# Patient Record
Sex: Male | Born: 1940 | Race: Black or African American | Hispanic: No
Health system: Southern US, Community
[De-identification: ages and names within clinical notes are randomized; demographics above are authoritative.]

## PROBLEM LIST (undated history)

## (undated) DIAGNOSIS — R569 Unspecified convulsions: Secondary | ICD-10-CM

## (undated) DIAGNOSIS — F0390 Unspecified dementia without behavioral disturbance: Secondary | ICD-10-CM

## (undated) HISTORY — DX: Unspecified convulsions: R56.9

---

## 2017-08-16 ENCOUNTER — Emergency Department (HOSPITAL_COMMUNITY): Payer: Medicare Other

## 2017-08-16 ENCOUNTER — Encounter (HOSPITAL_COMMUNITY): Payer: Self-pay | Admitting: Emergency Medicine

## 2017-08-16 ENCOUNTER — Emergency Department (HOSPITAL_COMMUNITY)
Admission: EM | Admit: 2017-08-16 | Discharge: 2017-08-17 | Disposition: A | Discharge status: Home / Self Care | Payer: Medicare Other | Attending: Emergency Medicine | Admitting: Emergency Medicine

## 2017-08-16 DIAGNOSIS — R600 Localized edema: Secondary | ICD-10-CM | POA: Insufficient documentation

## 2017-08-16 DIAGNOSIS — R748 Abnormal levels of other serum enzymes: Secondary | ICD-10-CM | POA: Diagnosis not present

## 2017-08-16 DIAGNOSIS — R404 Transient alteration of awareness: Secondary | ICD-10-CM | POA: Insufficient documentation

## 2017-08-16 DIAGNOSIS — Z79899 Other long term (current) drug therapy: Secondary | ICD-10-CM | POA: Diagnosis not present

## 2017-08-16 DIAGNOSIS — F0390 Unspecified dementia without behavioral disturbance: Secondary | ICD-10-CM | POA: Diagnosis not present

## 2017-08-16 HISTORY — DX: Unspecified dementia, unspecified severity, without behavioral disturbance, psychotic disturbance, mood disturbance, and anxiety: F03.90

## 2017-08-16 LAB — CBC WITH DIFFERENTIAL/PLATELET
Basophils Absolute: 0 10*3/uL (ref 0.0–0.1)
Basophils Relative: 0 %
EOS PCT: 0 %
Eosinophils Absolute: 0 10*3/uL (ref 0.0–0.7)
HCT: 38.2 % — ABNORMAL LOW (ref 39.0–52.0)
Hemoglobin: 12.2 g/dL — ABNORMAL LOW (ref 13.0–17.0)
LYMPHS ABS: 2.4 10*3/uL (ref 0.7–4.0)
LYMPHS PCT: 26 %
MCH: 27.1 pg (ref 26.0–34.0)
MCHC: 31.9 g/dL (ref 30.0–36.0)
MCV: 84.7 fL (ref 78.0–100.0)
MONO ABS: 0.7 10*3/uL (ref 0.1–1.0)
Monocytes Relative: 7 %
Neutro Abs: 6 10*3/uL (ref 1.7–7.7)
Neutrophils Relative %: 67 %
PLATELETS: 180 10*3/uL (ref 150–400)
RBC: 4.51 MIL/uL (ref 4.22–5.81)
RDW: 14.9 % (ref 11.5–15.5)
WBC: 9.1 10*3/uL (ref 4.0–10.5)

## 2017-08-16 LAB — COMPREHENSIVE METABOLIC PANEL
ALT: 13 U/L — ABNORMAL LOW (ref 17–63)
ANION GAP: 7 (ref 5–15)
AST: 22 U/L (ref 15–41)
Albumin: 3.5 g/dL (ref 3.5–5.0)
Alkaline Phosphatase: 658 U/L — ABNORMAL HIGH (ref 38–126)
BUN: 21 mg/dL — ABNORMAL HIGH (ref 6–20)
CHLORIDE: 104 mmol/L (ref 101–111)
CO2: 27 mmol/L (ref 22–32)
Calcium: 9.6 mg/dL (ref 8.9–10.3)
Creatinine, Ser: 1.18 mg/dL (ref 0.61–1.24)
GFR, EST NON AFRICAN AMERICAN: 58 mL/min — AB (ref >60)
Glucose, Bld: 106 mg/dL — ABNORMAL HIGH (ref 65–99)
POTASSIUM: 3.4 mmol/L — AB (ref 3.5–5.1)
Sodium: 138 mmol/L (ref 135–145)
Total Bilirubin: 0.7 mg/dL (ref 0.3–1.2)
Total Protein: 7 g/dL (ref 6.5–8.1)

## 2017-08-16 LAB — I-STAT TROPONIN, ED: Troponin i, poc: 0.02 ng/mL (ref 0.00–0.08)

## 2017-08-16 LAB — ACETAMINOPHEN LEVEL

## 2017-08-16 LAB — SALICYLATE LEVEL

## 2017-08-16 LAB — CBG MONITORING, ED: GLUCOSE-CAPILLARY: 92 mg/dL (ref 65–99)

## 2017-08-16 MED ORDER — LORAZEPAM 2 MG/ML IJ SOLN
0.5000 mg | Freq: Once | INTRAMUSCULAR | Status: AC | PRN
Start: 2017-08-16 — End: 2017-08-16
  Administered 2017-08-16: 0.5 mg via INTRAVENOUS
  Filled 2017-08-16: qty 1

## 2017-08-16 MED ORDER — SODIUM CHLORIDE 0.9 % IV BOLUS
500.0000 mL | Freq: Once | INTRAVENOUS | Status: AC
  Administered 2017-08-16: 500 mL via INTRAVENOUS

## 2017-08-16 MED ORDER — LEVETIRACETAM 500 MG PO TABS: 500.0000 mg | ORAL_TABLET | Freq: Two times a day (BID) | ORAL | 0 refills | Status: AC

## 2017-08-16 MED ORDER — LEVETIRACETAM 500 MG PO TABS
500.0000 mg | ORAL_TABLET | Freq: Once | ORAL | Status: AC
  Administered 2017-08-17: 500 mg via ORAL
  Filled 2017-08-16: qty 1

## 2017-08-16 NOTE — ED Provider Notes (Addendum)
[facility] EMERGENCY DEPARTMENT Provider Note   CSN: [id number] Arrival date & time: 08/16/17  1400     History   Chief Complaint Chief Complaint  Patient presents with  . Seizures    HPI Christopher Hobbs is a 77 y.o. male with a history of dementia BIB EMS from [facility] for a chief complaint of unresponsiveness.  The patient's niece reports that she received a call from staff stating that the patient was not responsive, was barely breathing, and that they were going to call EMS.  The patient reports that she arrived to the facility within minutes and found the patient sitting in a chair in the dining room slumped over with drool down his shirt. His clothes were soaked with urine and stool. His eyes were closed when she arrived, but he held and squeeze her hand, and would answer yes and no to questions.  She denies any unresponsiveness, shaking, jerking since her arrival.  No staff was with the patient upon her arrival.  Past medical history includes dementia.  The patient is oriented to self  at baseline and intermittently to place.  He has a history of chronic bilateral lower extremity edema and takes 40 mg of Lasix daily.  Family reports he has been started on several mood stabilizers including oxcarbazepine, trazodone, citalopram, and Zoloft since he has been at [facility].  [place] 5x since patient's arrival in the ED, but have not been able to make contact with staff.   Level 5 caveat secondary to dementia.   The history is provided by the patient. No language interpreter was used.  Seizures   Pertinent negatives include no vomiting.    Past Medical History:  Diagnosis Date  . Dementia     There are no active problems to display for this patient.   History reviewed. No pertinent surgical history.      Home Medications    Prior to Admission medications   Medication Sig Start Date End Date Taking? Authorizing Provider  furosemide  (LASIX) 40 MG tablet Take 40 mg by mouth.   Yes [provider]  ipratropium-albuterol (DUONEB) 0.5-2.5 (3) MG/3ML SOLN Take 3 mLs by nebulization as needed.   Yes [provider]  Melatonin 3 MG TABS Take 6 mg by mouth at bedtime.   Yes [provider]  nystatin (NYSTATIN) powder Apply 1 Bottle topically 2 (two) times daily.   Yes [provider]  Oxcarbazepine (TRILEPTAL) 300 MG tablet Take 300 mg by mouth 2 (two) times daily. Stable mood   Yes [provider]  sertraline (ZOLOFT) 50 MG tablet Take 50 mg by mouth daily.   Yes [provider]  Skin Protectants, Misc. (EUCERIN) cream Apply 1 application topically 2 (two) times daily.   Yes [provider]  traZODone (DESYREL) 50 MG tablet Take 25 mg by mouth 3 (three) times daily.   Yes [provider]  vitamin B-12 (CYANOCOBALAMIN) 1000 MCG tablet Take 1,000 mcg by mouth daily.   Yes [provider]  levETIRAcetam (KEPPRA) 500 MG tablet Take 1 tablet (500 mg total) by mouth 2 (two) times daily. 08/16/17 09/15/17  Lorayne Bender, PA-C    Family History No family history on file.  Social History Social History   Tobacco Use  . Smoking status: Not on file  Substance Use Topics  . Alcohol use: Not on file  . Drug use: Not on file     Allergies   Patient  has no known allergies.   Review of Systems Review of Systems  Unable to perform ROS: Dementia  Respiratory: Negative for shortness of breath.   Cardiovascular: Positive for leg swelling (chronic).  Gastrointestinal: Negative for vomiting.     Physical Exam Updated Vital Signs BP (!) 154/93 (BP Location: Right Arm)   Pulse 79   Temp 98.2 F (36.8 C)   Resp 17   SpO2 95%   Physical Exam  Constitutional: He is oriented to person, place, and time. He appears well-developed and well-nourished.  HENT:  Head: Normocephalic and atraumatic.  Eyes: Pupils are equal, round, and reactive to light. EOM  are normal. No scleral icterus.  Neck: Normal range of motion. Neck supple.  Cardiovascular: Normal rate, regular rhythm, normal heart sounds and intact distal pulses. Exam reveals no gallop and no friction rub.  No murmur heard. Pulmonary/Chest: Effort normal and breath sounds normal. No stridor. No respiratory distress. He has no wheezes. He has no rales. He exhibits no tenderness.  Abdominal: Soft. Bowel sounds are normal. He exhibits no distension and no mass. There is no tenderness. There is no rebound and no guarding. No hernia.  Musculoskeletal:  Significant edema to the bilateral lower extremities, which appears chronic.  Neurological: He is alert and oriented to person, place, and time. No cranial nerve deficit.  Oriented to self.  Patient intermittently follows commands.   Skin: Skin is warm and dry. Capillary refill takes less than 2 seconds. No rash noted. No erythema. No pallor.  Psychiatric: He has a normal mood and affect. His behavior is normal.   ED Treatments / Results  Labs (all labs ordered are listed, but only abnormal results are displayed) Labs Reviewed  CBC WITH DIFFERENTIAL/PLATELET - Abnormal; Notable for the following components:      Result Value   Hemoglobin 12.2 (*)    HCT 38.2 (*)    All other components within normal limits  COMPREHENSIVE METABOLIC PANEL - Abnormal; Notable for the following components:   Potassium 3.4 (*)    Glucose, Bld 106 (*)    BUN 21 (*)    ALT 13 (*)    Alkaline Phosphatase 658 (*)    GFR calc non Af Amer 58 (*)    All other components within normal limits  ACETAMINOPHEN LEVEL - Abnormal; Notable for the following components:   Acetaminophen (Tylenol), Serum <10 (*)    All other components within normal limits  SALICYLATE LEVEL  AMMONIA  CBG MONITORING, ED  I-STAT TROPONIN, ED    EKG EKG Interpretation  Date/Time:  Tuesday August 16 2017 16:49:04 EDT Ventricular Rate:  68 PR Interval:  220 QRS Duration: 110 QT  Interval:  424 QTC Calculation: 450 R Axis:   48 Text Interpretation:  Sinus rhythm with 1st degree A-V block Otherwise normal ECG No old tracing to compare Confirmed by Merrily Pew [phone]) on 08/16/2017 8:16:00 PM   Radiology Dg Chest 2 View  Result Date: 08/16/2017 CLINICAL DATA:  Unresponsive episode. EXAM: CHEST - 2 VIEW COMPARISON:  None. FINDINGS: Mild cardiomegaly is noted. No pneumothorax or pleural effusion is noted. No acute pulmonary disease is noted. Bony thorax is unremarkable. IMPRESSION: No active cardiopulmonary disease. Electronically Signed   By: Marijo Conception, M.D.   On: 08/16/2017 15:42   Ct Head Wo Contrast  Result Date: 08/16/2017 CLINICAL DATA:  Altered mental status, seizure like activity. EXAM: CT HEAD WITHOUT CONTRAST TECHNIQUE: Contiguous axial images were obtained from the base of the skull  through the vertex without intravenous contrast. COMPARISON:  None. FINDINGS: BRAIN: No intraparenchymal hemorrhage, mass effect nor midline shift. Moderate to severe ventriculomegaly with disproportion mesial temporal lobe volume loss. Patchy supratentorial white matter hypodensities. No acute large vascular territory infarcts. No abnormal extra-axial fluid collections. Basal cisterns are patent. VASCULAR: Mild calcific atherosclerosis of the carotid siphons. SKULL: No skull fracture. No significant scalp soft tissue swelling. SINUSES/ORBITS: Severe chronic RIGHT maxillary sinusitis. Mastoid air cells are well aerated. Soft tissue within the external auditory canals compatible with cerumen.The included ocular globes and orbital contents are non-suspicious. OTHER: None. IMPRESSION: 1. No acute intracranial process. 2. Moderate to severe atrophy with disproportionate mesial temporal lobe volume loss associated with neurodegenerative syndromes. 3. Mild to moderate chronic small vessel ischemic disease. Electronically Signed   By: Elon Alas M.D.   On: 08/16/2017 16:04     Procedures Procedures (including critical care time)  Medications Ordered in ED Medications  sodium chloride 0.9 % bolus 500 mL (0 mLs Intravenous Stopped 08/17/17 0032)  LORazepam (ATIVAN) injection 0.5 mg (0.5 mg Intravenous Given 08/16/17 2307)  levETIRAcetam (KEPPRA) tablet 500 mg (500 mg Oral Given 08/17/17 0011)     Initial Impression / Assessment and Plan / ED Course  I have reviewed the triage vital signs and the nursing notes.  Pertinent labs & imaging results that were available during my care of the patient were reviewed by me and considered in my medical decision making (see chart for details).  Clinical Course as of Aug 18 603  Tue Aug 16, 2017  1800 Attempted to look for adequate venous access for the purpose of blood draw, however, patient would not cooperate and would not allow me to even hold his arm or assess with the ultrasound, despite encouragement by his niece and great niece. Patient acknowledges the need for blood work, but states, "I hear what you are saying, but they already tried multiple times and I do not want to do it again."  Patient's niece and POA is at the bedside and agrees with waiting.   [SJ]  2322 Spoke with Dr. Ardis Hughs, [facility]. Typically not a worry in isolation. Not an emergent finding. States this can be worked up on an outpatient basis by patient's PCP or with GI.   Alkaline Phosphatase(!): 658 [SJ]  2335 Spoke with Dr. Rory Percy, neurologist.  Presentation sounds more consistent with seizure rather than TIA.  Requests ammonia level due to elevated alk phos. Start patient on Keppra 500 mg twice daily.  No loading dose necessary.   [SJ]  Wed Aug 17, 2017  0034 Suspected to be due to minor dehydration.  BUN(!): 21 [SJ]  0035 Patient would not lay still for the MRI, despite administration of Ativan for anxiety.  MR Brain Wo Contrast [SJ]    Clinical Course User Index [SJ] Joy, Shawn C, PA-C    77 year old male with a history of dementia  and chronic lower extremity edema presents from [place] facility for questionable episode of unresponsiveness versus seizure-like activity.  Patient is oriented only to self.  History provided by family as staff from [facility] has not been able to be reached.  On exam, the patient has bilateral lower extremity edema, which appears to be chronic.  He is oriented only to self.  No other focal findings at this time.  No seizure-like activity or syncope observed since arrival in the ED. NAD and patient is hemodynamically stable.  Patient was discussed with Dr. Dayna Barker, attending  physician.Patient care transferred to [facility] at the end of my shift pending labs, imaging, and EKG. Patient presentation, ED course, and plan of care discussed with review of all pertinent labs and imaging.  Please see his/her note for further details regarding further ED course and disposition.   Final Clinical Impressions(s) / ED Diagnoses   Final diagnoses:  Elevated alkaline phosphatase level  Transient alteration of awareness    ED Discharge Orders        Ordered    levETIRAcetam (KEPPRA) 500 MG tablet  2 times daily     08/16/17 2354       Jhamal Plucinski A, PA-C 08/16/17 1638    Zynia Wojtowicz A, PA-C 08/17/17 0919    Merrily Pew, MD 08/18/17 0045

## 2017-08-16 NOTE — ED Notes (Signed)
Pt arrived from Nursing home covered in stool wearing 3 briefs , pt pulled to rest room and cleaned up

## 2017-08-16 NOTE — ED Notes (Signed)
Clean patient up with the help of emt and charge nurse patient was covered up to his back with feces patient is now clean resting with family at bedside

## 2017-08-16 NOTE — ED Notes (Signed)
Writer attempted 3X for blood work, unsuccessful.

## 2017-08-16 NOTE — ED Notes (Signed)
Pt pulled out IV, PA unable to get ultrasound IV, IV team order placed

## 2017-08-16 NOTE — ED Notes (Signed)
Patient transported to MRI 

## 2017-08-16 NOTE — ED Notes (Signed)
Patient transported to X-ray 

## 2017-08-16 NOTE — ED Provider Notes (Signed)
Christopher Hobbs is a 77 y.o. male, with a history of dementia, presenting to the ED with episode of abnormal behavior.  Patient's niece at the bedside states she received a phone call at around 1 PM today from patient's facility, [facility], telling her that the patient was less responsive than normal.  When she arrived at the facility approximately 10 minutes later patient was seated at the table in the dining room, but leaning forward against the table with some noted drooling.  When she took his hand, he held her hand and verbally responded to her, but seems more confused.  Patient then suddenly became alert and oriented to self, as per his baseline.  Entire episode lasted approximately 15 minutes.  Facility was concerned for possible seizure, though there is no seizure activity noted. Patient denies any pain or complaints.  Niece denies shaking or seizures, known recent fever, illness, trauma, vomiting, diarrhea, or any other abnormalities.  Level 5 caveat due to dementia.  Patient's niece and POA, Marliss Coots, at the bedside providing much of the patient's history.   HPI from Joline Maxcy, PA-C: "Jonathon Tan is a 77 y.o. male with a history of dementia BIB EMS from [facility] for a chief complaint of unresponsiveness.  The patient's niece reports that she received a call from staff stating that the patient was not responsive, was barely breathing, and that they were going to call EMS.  The patient reports that she arrived to the facility within minutes and found the patient sitting in a chair in the dining room slumped over with drool down his shirt. His clothes were soaked with urine and stool. His eyes were closed when she arrived, but he held and squeeze her hand, and would answer yes and no to questions.  She denies any unresponsiveness, shaking, jerking since her arrival.  No staff was with the patient upon her arrival.  Past medical history includes dementia.  The patient is oriented to self  at  baseline and intermittently to place.  He has a history of chronic bilateral lower extremity edema and takes 40 mg of Lasix daily.  Family reports he has been started on several mood stabilizers including oxcarbazepine, trazodone, citalopram, and Zoloft since he has been at [facility].  [place] 5x since patient's arrival in the ED, but have not been able to make contact with staff."  Past Medical History:  Diagnosis Date  . Dementia      Physical Exam  BP 121/76 (BP Location: Right Arm)   Pulse 62   Temp 98.5 F (36.9 C) (Oral)   Resp 17   SpO2 100%   Physical Exam  Constitutional: He appears well-developed and well-nourished. No distress.  HENT:  Head: Normocephalic and atraumatic.  Eyes: Conjunctivae are normal.  Neck: Neck supple.  Cardiovascular: Normal rate, regular rhythm, normal heart sounds and intact distal pulses.  Pulmonary/Chest: Effort normal and breath sounds normal. No respiratory distress.  Abdominal: Soft. There is no tenderness. There is no guarding.  Musculoskeletal: He exhibits no edema.  Lymphadenopathy:    He has no cervical adenopathy.  Neurological: He is alert.  Patient is oriented to self, which is his reported baseline.  He is interactive and will hold some back-and-forth conversation.  He seems to understand that he is in a hospital and voices understanding with explanations of the proposed interventions. No sensory deficits.  No noted speech deficits. No aphasia. Patient handles oral secretions without difficulty. No noted swallowing defects.  Equal  grip strength bilaterally. Strength 5/5 in the upper extremities. Strength 5/5 with flexion and extension at the bilateral ankles. Cranial nerves III-XII grossly intact.  No facial droop.   Skin: Skin is warm and dry. He is not diaphoretic.  Psychiatric: He has a normal mood and affect. His behavior is normal.  Nursing note and vitals reviewed.   ED Course/Procedures   Clinical  Course as of Aug 18 34  Tue Aug 16, 2017  1800 Attempted to look for adequate venous access for the purpose of blood draw, however, patient would not cooperate and would not allow me to even hold his arm or assess with the ultrasound, despite encouragement by his niece and great niece. Patient acknowledges the need for blood work, but states, "I hear what you are saying, but they already tried multiple times and I do not want to do it again."  Patient's niece and POA is at the bedside and agrees with waiting.   [SJ]  2322 Spoke with Dr. Ardis Hughs, [facility]. Typically not a worry in isolation. Not an emergent finding. States this can be worked up on an outpatient basis by patient's PCP or with GI.   Alkaline Phosphatase(!): 658 [SJ]  2335 Spoke with Dr. Rory Percy, neurologist.  Presentation sounds more consistent with seizure rather than TIA.  Requests ammonia level due to elevated alk phos. Start patient on Keppra 500 mg twice daily.  No loading dose necessary.   [SJ]  Wed Aug 17, 2017  0034 Suspected to be due to minor dehydration.  BUN(!): 21 [SJ]  0035 Patient would not lay still for the MRI, despite administration of Ativan for anxiety.  MR Brain Wo Contrast [SJ]    Clinical Course User Index [SJ] Joy, Helane Gunther, PA-C    Procedures  MDM    Took patient care handoff report from [employer], PA-C.   Patient presents following episode of decreased responsiveness.  Patient responding to his baseline upon arrival in the ED and had no further episodes throughout ED course.   Patient is nontoxic appearing, afebrile, not tachycardic, not tachypneic, not hypotensive, excellent SPO2 on room air, and is in no apparent distress.  Seizure is high on differential.  Neurology follow-up.   Difficulty obtaining blood work during the ED course.  Patient oriented enough to refuse subsequent needle sticks after first attempts.  Similarly, patient would not provide urine and refused cath.  He did  eventually allow another attempt at blood draw, which was successful.  Since patient was oriented enough to acknowledge the proposed procedure and he is stable here in the ED, I do not think forcing the patient or sedating him is warranted.  Findings and plan of care discussed with Merrily Pew, MD.   Vitals:   08/16/17 1408 08/16/17 2341 08/17/17 0010  BP: 121/76 (!) 154/93   Pulse: 62 79   Resp: 17 17   Temp: 98.5 F (36.9 C) 98.2 F (36.8 C) 98.2 F (36.8 C)  TempSrc: Oral Oral   SpO2: 100% 95%       Layla Maw 08/17/17 0036    Mesner, Corene Cornea, MD 08/18/17 0045

## 2017-08-16 NOTE — ED Triage Notes (Signed)
Pt here from [facility] home with c/o seizure like activity / family stated that pt was abnormal for a few mins and then was fine , no one witnessed any "shaking or seizure like activity

## 2017-08-16 NOTE — Discharge Instructions (Addendum)
There were no acute abnormalities on the CT scan.  There was some evidence of dehydration. Please encourage patient to drink more water. One of the patient's lab results, called alkaline phosphatase, was noted to be quite elevated.  This will need to be assessed by the patient's primary care provider or with the gastroenterologist..  It is possible that the patient had a seizure.  Please begin administering the Keppra 500 mg twice daily.  Follow-up with a neurologist.

## 2017-08-16 NOTE — ED Notes (Signed)
Informed Ernie Avena PA this RN, 2 phlebotomist unable to draw labs

## 2017-08-16 NOTE — ED Notes (Signed)
Pt unable to tolerate mri

## 2017-08-17 DIAGNOSIS — R404 Transient alteration of awareness: Secondary | ICD-10-CM | POA: Diagnosis not present

## 2017-08-17 LAB — AMMONIA: AMMONIA: 29 umol/L (ref 9–35)

## 2017-08-17 NOTE — ED Notes (Signed)
Pt family members d/c instructions gone over and with facility

## 2017-08-29 ENCOUNTER — Ambulatory Visit: Payer: Medicare Other | Admitting: Neurology

## 2017-08-29 ENCOUNTER — Encounter: Payer: Self-pay | Admitting: Neurology

## 2020-04-21 IMAGING — CT CT HEAD W/O CM
4 series · 16 of 47 positions shown, 18 images · non-contrast
Comparison: None.

CLINICAL DATA: Altered mental status, seizure like activity.

EXAM:
CT HEAD WITHOUT CONTRAST
TECHNIQUE: Contiguous axial images were obtained from the base of the skull
through the vertex without intravenous contrast.

[Series 3: head wo · axial · 0.48mm/px · z∈[-104,+16]mm · 7 of 33 slices shown, 9 images]
[im 5/33  brain]
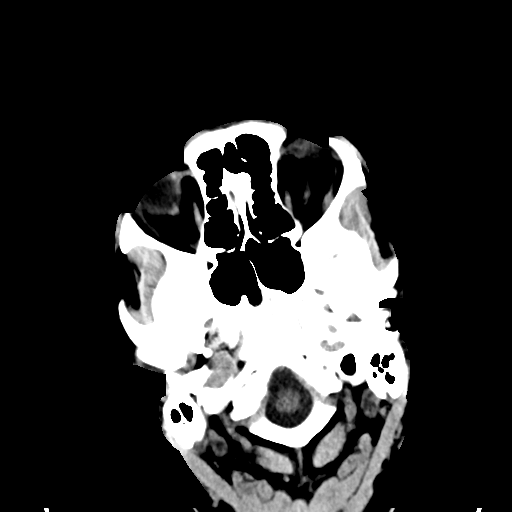
[im 5/33  bone]
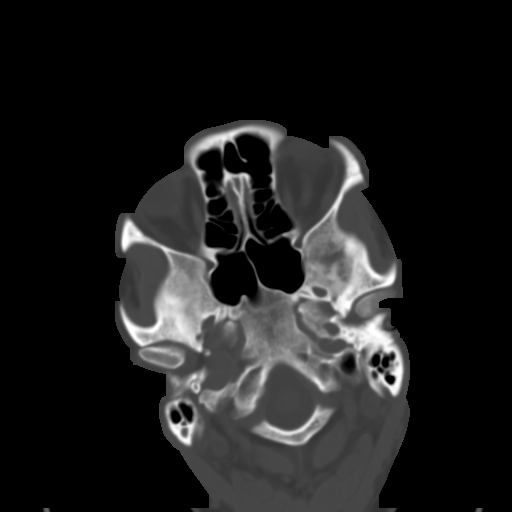
[im 9/33  brain]
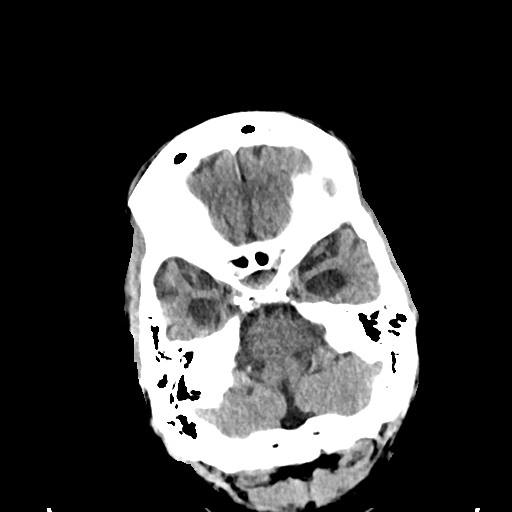
[im 13/33  brain]
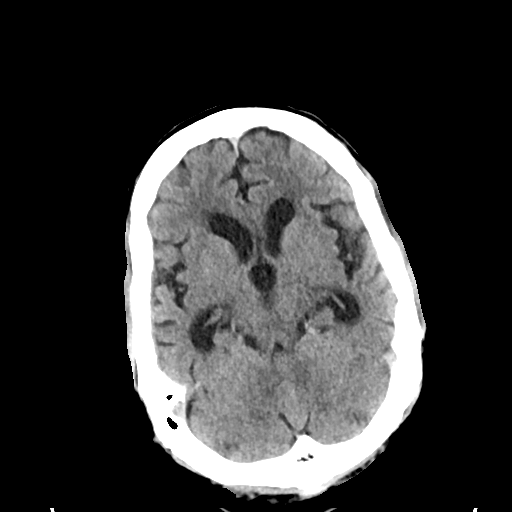
[im 17/33  brain]
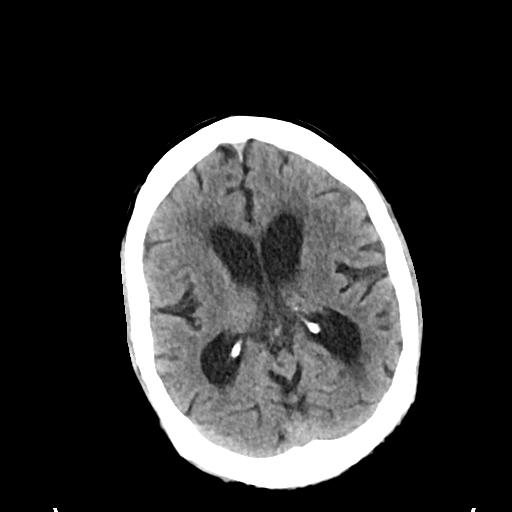
[im 21/33  brain]
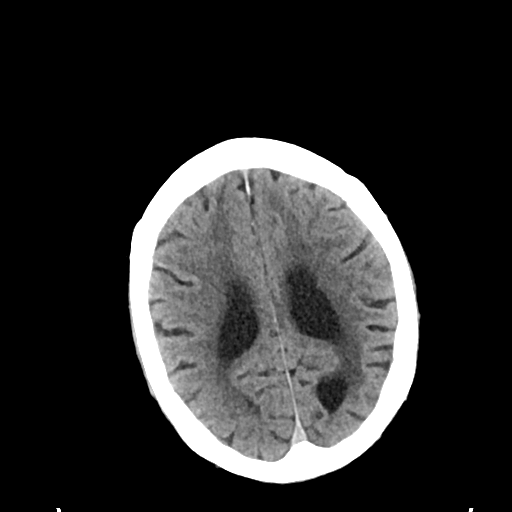
[im 21/33  bone]
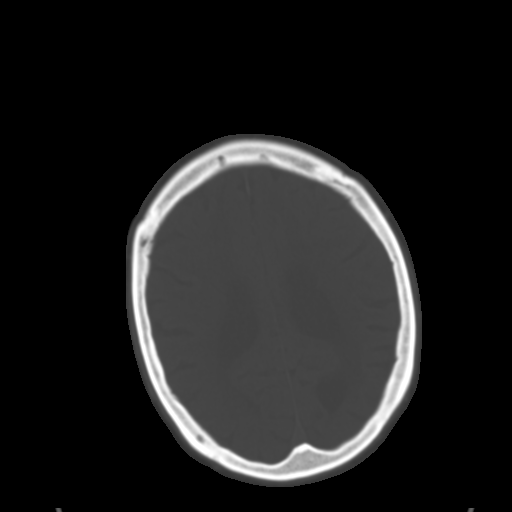
[im 25/33  brain]
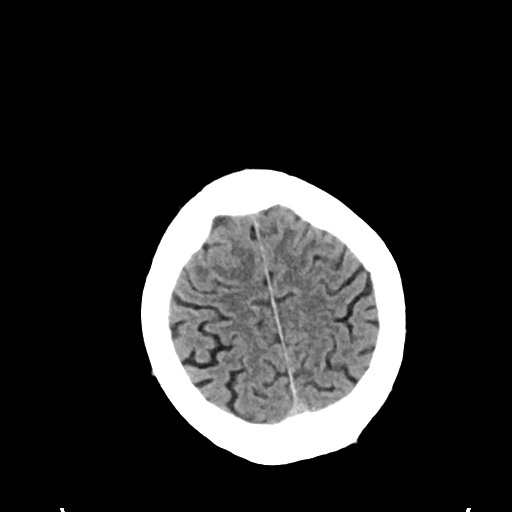
[im 29/33  brain]
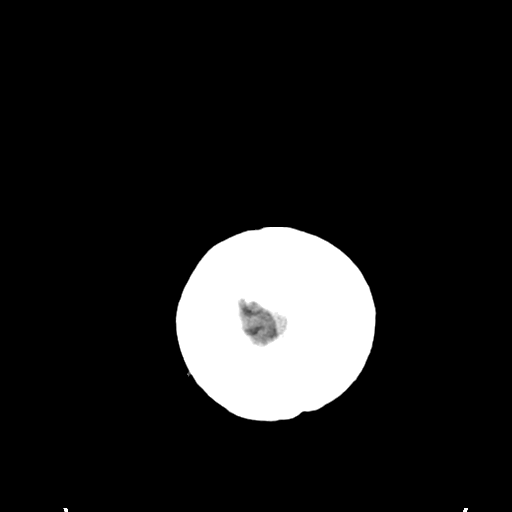

[Series 4: head bone · axial · 0.48mm/px · z∈[-108,-76]mm · 3 of 82 slices shown]
[im 9/82  bone]
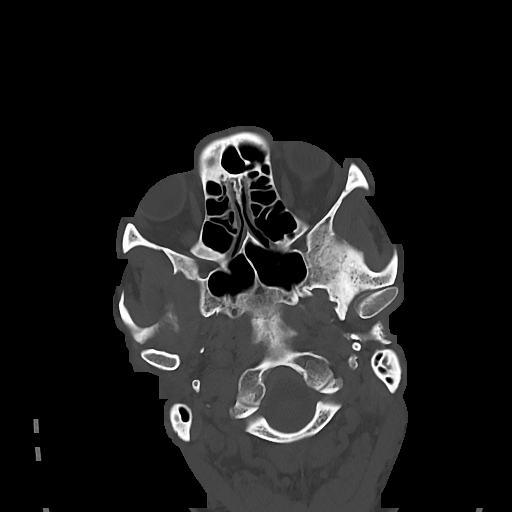
[im 17/82  bone]
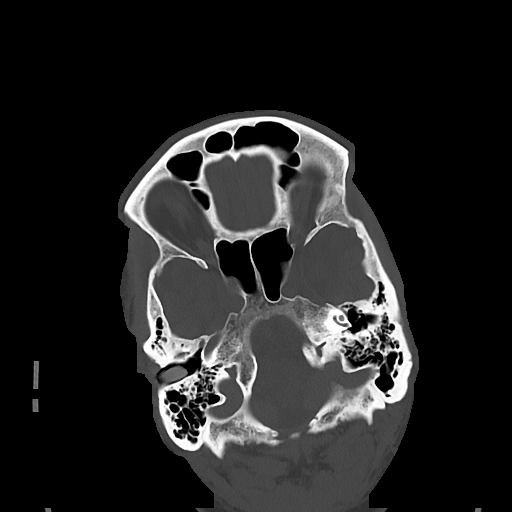
[im 25/82  bone]
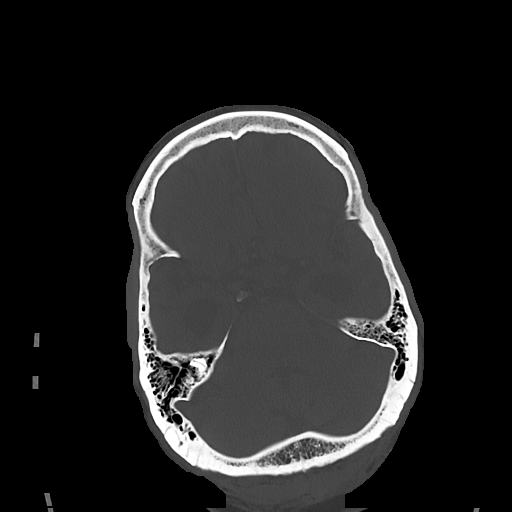

[Series 5: cor soft · coronal · 0.33mm/px · 3 of 73 slices shown]
[im 25/73  brain]
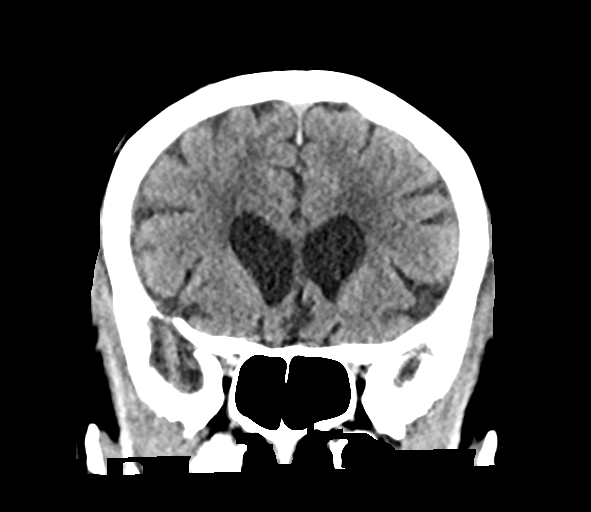
[im 33/73  brain]
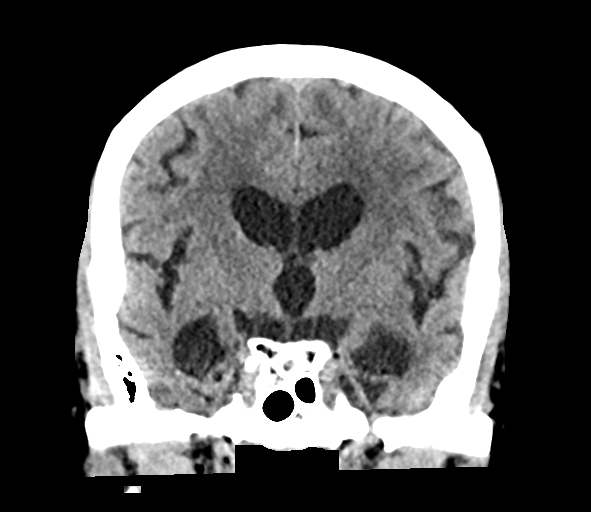
[im 41/73  brain]
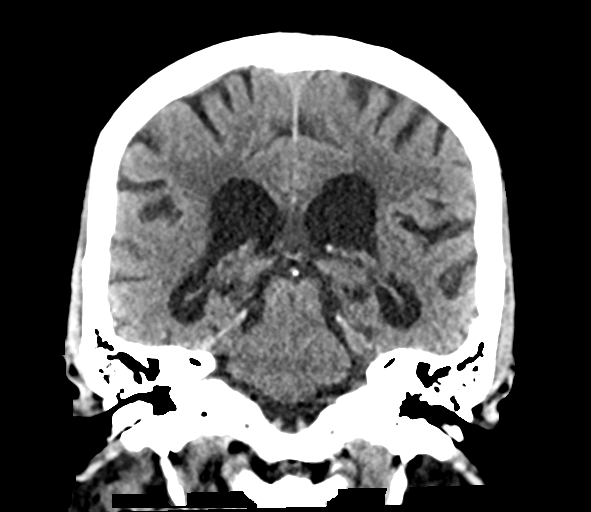

[Series 6: sag soft · sagittal · 0.33mm/px · 3 of 66 slices shown]
[im 22/66  brain]
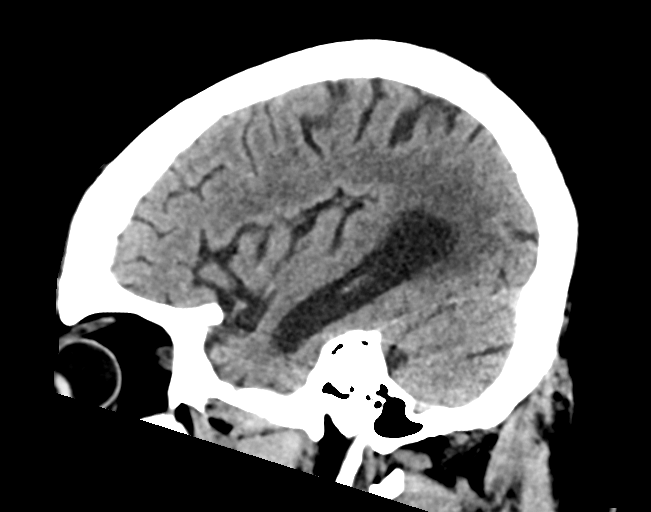
[im 33/66  brain]
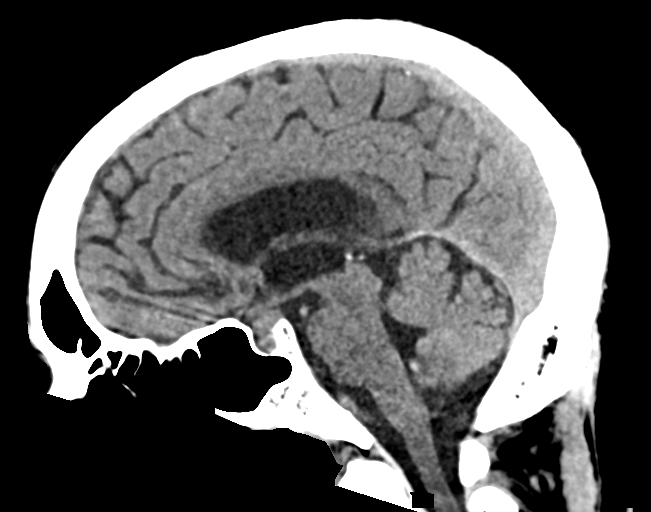
[im 44/66  brain]
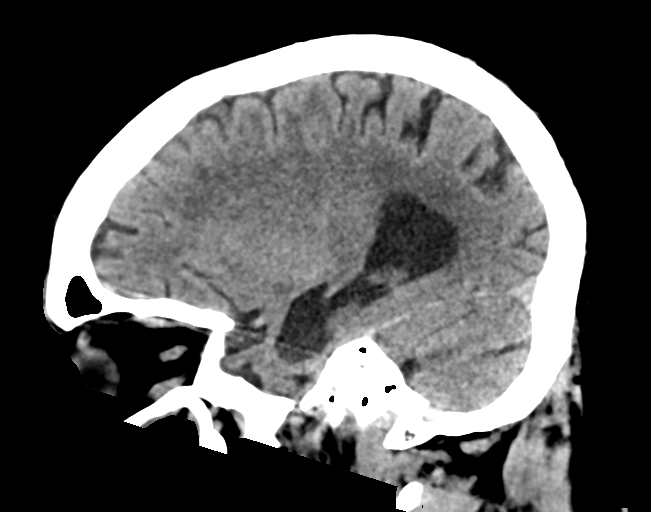

[16 of 47 positions shown; findings below may reference images not displayed]

FINDINGS: BRAIN: No intraparenchymal hemorrhage, mass effect nor midline
shift. Moderate to severe ventriculomegaly with disproportion mesial
temporal lobe volume loss. Patchy supratentorial white matter
hypodensities. No acute large vascular territory infarcts. No
abnormal extra-axial fluid collections. Basal cisterns are patent.

VASCULAR: Mild calcific atherosclerosis of the carotid siphons.

SKULL: No skull fracture. No significant scalp soft tissue swelling.

SINUSES/ORBITS: Severe chronic RIGHT maxillary sinusitis. Mastoid
air cells are well aerated. Soft tissue within the external auditory
canals compatible with cerumen.The included ocular globes and
orbital contents are non-suspicious.

OTHER: None.
IMPRESSION: 1. No acute intracranial process.
2. Moderate to severe atrophy with disproportionate mesial temporal
lobe volume loss associated with neurodegenerative syndromes.
3. Mild to moderate chronic small vessel ischemic disease.

## 2021-07-19 ENCOUNTER — Other Ambulatory Visit: Payer: Self-pay

## 2021-07-19 ENCOUNTER — Encounter (HOSPITAL_COMMUNITY): Payer: Self-pay | Admitting: Emergency Medicine

## 2021-07-19 ENCOUNTER — Emergency Department (HOSPITAL_COMMUNITY)
Admission: EM | Admit: 2021-07-19 | Discharge: 2021-07-19 | Disposition: A | Discharge status: Home / Self Care | Payer: Medicare Other | Attending: Emergency Medicine | Admitting: Emergency Medicine

## 2021-07-19 ENCOUNTER — Emergency Department (HOSPITAL_COMMUNITY): Payer: Medicare Other

## 2021-07-19 DIAGNOSIS — D649 Anemia, unspecified: Secondary | ICD-10-CM | POA: Diagnosis not present

## 2021-07-19 DIAGNOSIS — R1110 Vomiting, unspecified: Secondary | ICD-10-CM | POA: Diagnosis present

## 2021-07-19 DIAGNOSIS — R739 Hyperglycemia, unspecified: Secondary | ICD-10-CM | POA: Insufficient documentation

## 2021-07-19 DIAGNOSIS — F0390 Unspecified dementia without behavioral disturbance: Secondary | ICD-10-CM | POA: Diagnosis not present

## 2021-07-19 LAB — CBC WITH DIFFERENTIAL/PLATELET
Abs Immature Granulocytes: 0.03 10*3/uL (ref 0.00–0.07)
Basophils Absolute: 0 10*3/uL (ref 0.0–0.1)
Basophils Relative: 0 %
Eosinophils Absolute: 0.1 10*3/uL (ref 0.0–0.5)
Eosinophils Relative: 1 %
HCT: 37.7 % — ABNORMAL LOW (ref 39.0–52.0)
Hemoglobin: 11.6 g/dL — ABNORMAL LOW (ref 13.0–17.0)
Immature Granulocytes: 0 %
Lymphocytes Relative: 18 %
Lymphs Abs: 1.8 10*3/uL (ref 0.7–4.0)
MCH: 27.8 pg (ref 26.0–34.0)
MCHC: 30.8 g/dL (ref 30.0–36.0)
MCV: 90.2 fL (ref 80.0–100.0)
Monocytes Absolute: 0.9 10*3/uL (ref 0.1–1.0)
Monocytes Relative: 9 %
Neutro Abs: 6.9 10*3/uL (ref 1.7–7.7)
Neutrophils Relative %: 72 %
Platelets: 212 10*3/uL (ref 150–400)
RBC: 4.18 MIL/uL — ABNORMAL LOW (ref 4.22–5.81)
RDW: 13.9 % (ref 11.5–15.5)
WBC: 9.6 10*3/uL (ref 4.0–10.5)
nRBC: 0 % (ref 0.0–0.2)

## 2021-07-19 LAB — COMPREHENSIVE METABOLIC PANEL
ALT: 14 U/L (ref 0–44)
AST: 20 U/L (ref 15–41)
Albumin: 3.6 g/dL (ref 3.5–5.0)
Alkaline Phosphatase: 640 U/L — ABNORMAL HIGH (ref 38–126)
Anion gap: 7 (ref 5–15)
BUN: 29 mg/dL — ABNORMAL HIGH (ref 8–23)
CO2: 30 mmol/L (ref 22–32)
Calcium: 9.7 mg/dL (ref 8.9–10.3)
Chloride: 105 mmol/L (ref 98–111)
Creatinine, Ser: 1.09 mg/dL (ref 0.61–1.24)
GFR, Estimated: >60 mL/min (ref >60)
Glucose, Bld: 128 mg/dL — ABNORMAL HIGH (ref 70–99)
Potassium: 3.9 mmol/L (ref 3.5–5.1)
Sodium: 142 mmol/L (ref 135–145)
Total Bilirubin: 0.2 mg/dL — ABNORMAL LOW (ref 0.3–1.2)
Total Protein: 7.5 g/dL (ref 6.5–8.1)

## 2021-07-19 LAB — LIPASE, BLOOD: Lipase: 31 U/L (ref 11–51)

## 2021-07-19 MED ORDER — ONDANSETRON 8 MG PO TBDP
8.0000 mg | ORAL_TABLET | Freq: Once | ORAL | Status: DC
  Filled 2021-07-19: qty 1

## 2021-07-19 NOTE — Discharge Instructions (Addendum)
He has not had any more vomiting.  His laboratory testing was normal.  Start with a clear liquid diet and gradually advance to regular foods after a day or 2. ?

## 2021-07-19 NOTE — ED Triage Notes (Signed)
BIB EMS from [facility].  Baseline severe dementia. Called out for episode of vomiting.  VSS.    ?

## 2021-07-19 NOTE — ED Notes (Signed)
Family member, Edson Snowball, called and updated on pt's pending discharge back to [facility] ? ?

## 2021-07-19 NOTE — ED Notes (Signed)
Pt refused zofran administration despite much coaching.  RN unable to obtain blood work as well.  Will attempt again with additional staff for help.  ? ?

## 2021-07-19 NOTE — ED Notes (Signed)
Pt drinking water 

## 2021-07-19 NOTE — ED Provider Notes (Signed)
?[facility]-EMERGENCY DEPT ?Provider Note ? ? ?CSN: [id number] ?Arrival date & time: 07/19/21  1749 ? ?  ? ?History ? ?Chief Complaint  ?Patient presents with  ? Emesis  ? ? ?Christopher Hobbs is a 81 y.o. male. ? ?HPI ?Patient presenting from his nursing home, where he apparently vomited once.  He is unable to give any history due to severe dementia. ?  ? ?Home Medications ?Prior to Admission medications   ?Medication Sig Start Date End Date Taking? Authorizing Provider  ?furosemide (LASIX) 40 MG tablet Take 40 mg by mouth.    [provider]  ?ipratropium-albuterol (DUONEB) 0.5-2.5 (3) MG/3ML SOLN Take 3 mLs by nebulization as needed.    [provider]  ?levETIRAcetam (KEPPRA) 500 MG tablet Take 1 tablet (500 mg total) by mouth 2 (two) times daily. 08/16/17 09/15/17  Joy, Shawn C, PA-C  ?Melatonin 3 MG TABS Take 6 mg by mouth at bedtime.    [provider]  ?nystatin (NYSTATIN) powder Apply 1 Bottle topically 2 (two) times daily.    [provider]  ?Oxcarbazepine (TRILEPTAL) 300 MG tablet Take 300 mg by mouth 2 (two) times daily. Stable mood    [provider]  ?sertraline (ZOLOFT) 50 MG tablet Take 50 mg by mouth daily.    [provider]  ?Skin Protectants, Misc. (EUCERIN) cream Apply 1 application topically 2 (two) times daily.    [provider]  ?traZODone (DESYREL) 50 MG tablet Take 25 mg by mouth 3 (three) times daily.    [provider]  ?vitamin B-12 (CYANOCOBALAMIN) 1000 MCG tablet Take 1,000 mcg by mouth daily.    [provider]  ?   ? ?Allergies    ?Patient has no known allergies.   ? ?Review of Systems   ?Review of Systems ? ?Physical Exam ?Updated Vital Signs ?BP (!) 177/85   Pulse 70   Temp (!) 97.5 ?F (36.4 ?C) (Oral)   Resp 16   Ht 6' (1.829 m)   Wt 108.9 kg   SpO2 97%   BMI 32.55 kg/m?  ?Physical Exam ?Vitals and nursing note reviewed.  ?Constitutional:   ?   General: He is not in acute distress. ?    Appearance: He is well-developed.  ?HENT:  ?   Head: Normocephalic and atraumatic.  ?   Right Ear: External ear normal.  ?   Left Ear: External ear normal.  ?Eyes:  ?   Conjunctiva/sclera: Conjunctivae normal.  ?   Pupils: Pupils are equal, round, and reactive to light.  ?Neck:  ?   Trachea: Phonation normal.  ?Cardiovascular:  ?   Rate and Rhythm: Normal rate.  ?Pulmonary:  ?   Effort: Pulmonary effort is normal.  ?   Breath sounds: Normal breath sounds.  ?Abdominal:  ?   General: There is no distension.  ?   Tenderness: There is no abdominal tenderness.  ?Musculoskeletal:     ?   General: Normal range of motion.  ?   Cervical back: Normal range of motion and neck supple.  ?Skin: ?   General: Skin is warm and dry.  ?Neurological:  ?   Mental Status: He is alert.  ?   Cranial Nerves: No cranial nerve deficit.  ?   Motor: No abnormal muscle tone.  ?   Coordination: Coordination normal.  ?   Comments: Alert and responsive.  Confused speech.  ?Psychiatric:     ?   Mood and Affect: Mood normal.     ?     Behavior: Behavior normal.  ?   Comments: Patient pushed me away during examination efforts.  ? ? ?ED Results / Procedures / Treatments   ?Labs ?(all labs ordered are listed, but only abnormal results are displayed) ?Labs Reviewed  ?CBC WITH DIFFERENTIAL/PLATELET - Abnormal; Notable for the following components:  ?    Result Value  ? RBC 4.18 (*)   ? Hemoglobin 11.6 (*)   ? HCT 37.7 (*)   ? All other components within normal limits  ?COMPREHENSIVE METABOLIC PANEL - Abnormal; Notable for the following components:  ? Glucose, Bld 128 (*)   ? BUN 29 (*)   ? Alkaline Phosphatase 640 (*)   ? Total Bilirubin 0.2 (*)   ? All other components within normal limits  ?LIPASE, BLOOD  ?URINALYSIS, ROUTINE W REFLEX MICROSCOPIC  ? ? ?EKG ?None ? ?Radiology ?DG Chest Port 1 View ? ?Result Date: 07/19/2021 ?CLINICAL DATA:  Abdominal pain with vomiting. EXAM: PORTABLE CHEST 1 VIEW COMPARISON:  Chest x-ray 08/16/2017. FINDINGS: Patient's  hand is overlying the left lower chest. The heart size and mediastinal contours are within normal limits. Both lungs are clear. The visualized skeletal structures are unremarkable. IMPRESSION: No active disease. Electronically Signed   By: Amy  Guttmann M.D.   On: 07/19/2021 19:14   ? ?Procedures ?Procedures  ? ? ?Medications Ordered in ED ?Medications  ?ondansetron (ZOFRAN-ODT) disintegrating tablet 8 mg (8 mg Oral Patient Refused/Not Given 07/19/21 1959)  ? ? ?ED Course/ Medical Decision Making/ A&P ?  ?                        ?Medical Decision Making ?Patient presenting from his nursing care facility for a single episode of vomiting.  He is unable to give history.  Note sent with patient reviewed. ? ?Problems Addressed: ?Vomiting, unspecified vomiting type, unspecified whether nausea present: acute illness or injury ? ?Amount and/or Complexity of Data Reviewed ?Independent Historian: caregiver ?   Details: Notes from nursing care facility ?External Data Reviewed: notes. ?   Details: Notes from nursing care facility ?Labs: ordered. ?   Details: CBC, metabolic panel-normal except hemoglobin low, glucose high, BUN high, alk phos stays high ?Radiology: ordered. ?   Details: Chest x-ray, no acute abnormalities ? ?Risk ?Prescription drug management. ?Decision regarding hospitalization. ?Risk Details: Patient with a single episode of vomiting today.  In the ED he has been able to drink water without vomiting.  Screening laboratory test appear good.  No evidence for acute infection or metabolic disorder.  Patient remained comfortable and verbal but confused in the ED.  No indication for hospitalization at this time. ? ? ? ? ? ? ? ? ? ? ?Final Clinical Impression(s) / ED Diagnoses ?Final diagnoses:  ?Vomiting, unspecified vomiting type, unspecified whether nausea present  ? ? ?Rx / DC Orders ?ED Discharge Orders   ? ? None  ? ?  ? ? ?  ?, , MD ?07/19/21 2339 ? ?

## 2021-07-19 NOTE — ED Notes (Addendum)
Call Donna Christen updates ?

## 2021-07-19 NOTE — ED Notes (Signed)
Report called to Windell Moulding at [facility]. PTAR called ? ?

## 2021-09-09 ENCOUNTER — Other Ambulatory Visit: Payer: Self-pay | Admitting: Nurse Practitioner

## 2021-09-09 ENCOUNTER — Other Ambulatory Visit (HOSPITAL_COMMUNITY): Payer: Self-pay | Admitting: Nurse Practitioner

## 2021-09-09 DIAGNOSIS — E8809 Other disorders of plasma-protein metabolism, not elsewhere classified: Secondary | ICD-10-CM

## 2021-10-06 ENCOUNTER — Other Ambulatory Visit: Payer: Self-pay | Admitting: Nurse Practitioner

## 2021-10-06 ENCOUNTER — Other Ambulatory Visit (HOSPITAL_COMMUNITY): Payer: Self-pay | Admitting: Nurse Practitioner

## 2022-08-02 ENCOUNTER — Emergency Department (HOSPITAL_COMMUNITY)
Admission: EM | Admit: 2022-08-02 | Discharge: 2022-08-02 | Disposition: A | Discharge status: Skilled Nursing Facility | Payer: Medicare Other | Attending: Emergency Medicine | Admitting: Emergency Medicine

## 2022-08-02 ENCOUNTER — Emergency Department (HOSPITAL_COMMUNITY): Payer: Medicare Other

## 2022-08-02 DIAGNOSIS — F0390 Unspecified dementia without behavioral disturbance: Secondary | ICD-10-CM | POA: Insufficient documentation

## 2022-08-02 DIAGNOSIS — D649 Anemia, unspecified: Secondary | ICD-10-CM | POA: Insufficient documentation

## 2022-08-02 DIAGNOSIS — R55 Syncope and collapse: Secondary | ICD-10-CM

## 2022-08-02 LAB — CBC WITH DIFFERENTIAL/PLATELET
Abs Immature Granulocytes: 0.02 10*3/uL (ref 0.00–0.07)
Basophils Absolute: 0 10*3/uL (ref 0.0–0.1)
Basophils Relative: 0 %
Eosinophils Absolute: 0.1 10*3/uL (ref 0.0–0.5)
Eosinophils Relative: 1 %
HCT: 37.2 % — ABNORMAL LOW (ref 39.0–52.0)
Hemoglobin: 11.6 g/dL — ABNORMAL LOW (ref 13.0–17.0)
Immature Granulocytes: 0 %
Lymphocytes Relative: 27 %
Lymphs Abs: 1.8 10*3/uL (ref 0.7–4.0)
MCH: 27.5 pg (ref 26.0–34.0)
MCHC: 31.2 g/dL (ref 30.0–36.0)
MCV: 88.2 fL (ref 80.0–100.0)
Monocytes Absolute: 0.7 10*3/uL (ref 0.1–1.0)
Monocytes Relative: 10 %
Neutro Abs: 4.2 10*3/uL (ref 1.7–7.7)
Neutrophils Relative %: 62 %
Platelets: 218 10*3/uL (ref 150–400)
RBC: 4.22 MIL/uL (ref 4.22–5.81)
RDW: 14.3 % (ref 11.5–15.5)
WBC: 6.9 10*3/uL (ref 4.0–10.5)
nRBC: 0 % (ref 0.0–0.2)

## 2022-08-02 LAB — COMPREHENSIVE METABOLIC PANEL
ALT: 10 U/L (ref 0–44)
AST: 18 U/L (ref 15–41)
Albumin: 3.1 g/dL — ABNORMAL LOW (ref 3.5–5.0)
Alkaline Phosphatase: 593 U/L — ABNORMAL HIGH (ref 38–126)
Anion gap: 7 (ref 5–15)
BUN: 20 mg/dL (ref 8–23)
CO2: 27 mmol/L (ref 22–32)
Calcium: 9.4 mg/dL (ref 8.9–10.3)
Chloride: 106 mmol/L (ref 98–111)
Creatinine, Ser: 1.12 mg/dL (ref 0.61–1.24)
GFR, Estimated: >60 mL/min (ref >60)
Glucose, Bld: 114 mg/dL — ABNORMAL HIGH (ref 70–99)
Potassium: 3.5 mmol/L (ref 3.5–5.1)
Sodium: 140 mmol/L (ref 135–145)
Total Bilirubin: 0.5 mg/dL (ref 0.3–1.2)
Total Protein: 7.2 g/dL (ref 6.5–8.1)

## 2022-08-02 LAB — URINALYSIS, ROUTINE W REFLEX MICROSCOPIC
Bilirubin Urine: NEGATIVE
Glucose, UA: NEGATIVE mg/dL
Ketones, ur: NEGATIVE mg/dL
Nitrite: NEGATIVE
Protein, ur: NEGATIVE mg/dL
Specific Gravity, Urine: 1.015 (ref 1.005–1.030)
pH: 5 (ref 5.0–8.0)

## 2022-08-02 NOTE — ED Triage Notes (Signed)
Patient BIB [facility] from [facility] after syncopal appearing episode during which patient had fecal incontinence. Facility reported that patient was eating lunch and "went out", they attempted to start CPR but patient responded to first compression so they stopped. Patient VSS, currently GCS of 12. En route pt responded only to painful stimuli.

## 2022-08-02 NOTE — ED Provider Notes (Signed)
  [place] EMERGENCY DEPARTMENT AT [facility] Transfer of Care Note I assumed care of Christopher Hobbs on 08/02/2022 at 3 pm from [place], [place].   Briefly, Christopher Hobbs is a 82 y.o. male who: PMHx: Dementia, full code P/w had an episode while eating today where he became less responsive, received a singular chest compression and was responsive thereafter, was hemodynamically stable en route with EMS as well as in the ED, patient now at baseline   Plan at the time of handoff: Follow-up labs, anticipate discharge   Please refer to the original provider's note for additional information regarding the care of [facility].  Reassessment: I personally reassessed the patient: Resting comfortably in bed  Vital Signs:  ED Triage Vitals  Enc Vitals Group     BP 08/02/22 1357 (!) 119/99     Pulse Rate 08/02/22 1357 72     Resp 08/02/22 1357 15     Temp --      Temp src --      SpO2 08/02/22 1353 96 %     Weight --      Height --      Head Circumference --      Peak Flow --      Pain Score --      Pain Loc --      Pain Edu? --      Excl. in GC? --      Hemodynamics:  The patient is hemodynamically stable. Mental Status:  The patient is at his reported baseline  Additional MDM: CMP without AKI or emergent electrolyte derangement, CBC without leukocytosis or thrombocytopenia.  Mild anemia.  Patient did not spontaneously produce urine exam, In-N-Out catheterization performed by nursing, UA with trace LE, negative nitrites, no significant WBC.  Not consistent with UTI, given patient is at reported baseline, no leukocytosis, afebrile, do not feel that patient requires antibiotics at this time.  Discharged back to nursing facility.  Disposition: DISCHARGE: I believe that the patient is safe for discharge home with outpatient follow-up. Patient was informed of all pertinent physical exam, laboratory, and imaging findings. Patient's suspected etiology of their symptom presentation was  discussed with the patient and all questions were answered. We discussed following up with PCP. I provided thorough ED return precautions. The patient feels safe and comfortable with this plan.  Patient seen in conjunction with Dr. Javier Glazier, MD Emergency Medicine, PGY-2    Curley Spice, MD 08/03/22 3606    Derwood Kaplan, MD 08/03/22 [phone]

## 2022-08-02 NOTE — ED Notes (Signed)
Please up date family 336 [phone]

## 2022-08-02 NOTE — Discharge Instructions (Signed)
[name]  Thank you for allowing [place] to take care of you today.  You came to the Emergency Department today because Christopher Hobbs had an episode while he was eating today where he was less responsive than usual, here in the emergency department his vitals are stable, we watched him for several hours and he did not have any episodes where he changed from his baseline mental status.  We checked his blood work, all of his blood salts are looking good, his infection cell count is not elevated.  We checked his urine, he does not have a definitive urinary tract infection, given he is not having a fever and elevation of his infection cell count, he does not need antibiotics at this time, he is safe to go home and follow-up with his regular doctor.[name]   To-Do: 1. Please follow-up with your primary doctor within 2 days / as soon as possible.   Please return to the Emergency Department or call 911 if you experience have worsening of your symptoms, or do not get better, chest pain, shortness of breath, severe or significantly worsening pain, high fever, severe confusion, pass out or have any reason to think that you need emergency medical care.   We hope you feel better soon.   Department of Emergency Medicine [facility]

## 2022-08-02 NOTE — ED Provider Notes (Signed)
[place] EMERGENCY DEPARTMENT AT [facility] Provider Note   CSN: [id number] Arrival date & time: 08/02/22  1344     History  Chief Complaint  Patient presents with   Loss of Consciousness    Christopher Hobbs is a 82 y.o. male.  EMS was called to [facility] nursing home due to patient being unresponsive.  The nurse at the facility states patient lost consciousness and CPR was performed.  They report doing 1 compression and patient came back around.  EMS reports when they arrived patient was awake he is confused they report he had good pulses.  Patient is unable to provide any history.  Patient has a past medical history of dementia.  I was able to review previous ER notes that report patient is not able to communicate history or concerns.  Reported to have been well earlier in the day  The history is provided by the patient. No language interpreter was used.  Loss of Consciousness      Home Medications Prior to Admission medications   Medication Sig Start Date End Date Taking? Authorizing Provider  Cholecalciferol (VITAMIN D3) 1.25 MG (50000 UT) TABS Take 1.25 mg by mouth once a week. Saturday   Yes [provider]  fluticasone (FLONASE) 50 MCG/ACT nasal spray Place 1 spray into both nostrils 2 (two) times daily. 06/11/22  Yes [provider]  furosemide (LASIX) 40 MG tablet Take 40 mg by mouth daily.   Yes [provider]  levETIRAcetam (KEPPRA) 500 MG tablet Take 1 tablet (500 mg total) by mouth 2 (two) times daily. Patient taking differently: Take 500 mg by mouth at bedtime. 08/16/17 08/02/22 Yes Joy, Shawn C, PA-C  lisinopril (ZESTRIL) 10 MG tablet Take 10 mg by mouth daily. 06/11/22  Yes [provider]  Melatonin 3 MG TABS Take 6 mg by mouth at bedtime.   Yes [provider]  montelukast (SINGULAIR) 10 MG tablet Take 10 mg by mouth at bedtime. 06/11/22  Yes [provider]  OXcarbazepine (TRILEPTAL) 150 MG tablet Take  150 mg by mouth at bedtime. 06/11/22  Yes [provider]  Oxcarbazepine (TRILEPTAL) 300 MG tablet Take 300 mg by mouth daily. Stable mood   Yes [provider]  PARoxetine (PAXIL) 10 MG tablet Take 10 mg by mouth daily. 06/11/22  Yes [provider]  Skin Protectants, Misc. (EUCERIN) cream Apply 1 application topically 2 (two) times daily.   Yes [provider]  vitamin B-12 (CYANOCOBALAMIN) 1000 MCG tablet Take 1,000 mcg by mouth daily.   Yes [provider]      Allergies    Patient has no known allergies.    Review of Systems   Review of Systems  Unable to perform ROS: Dementia  Cardiovascular:  Positive for syncope.    Physical Exam Updated Vital Signs BP (!) 118/98   Pulse 72   Resp 19   SpO2 97%  Physical Exam Vitals reviewed.  HENT:     Head: Normocephalic.     Mouth/Throat:     Mouth: Mucous membranes are moist.  Eyes:     Pupils: Pupils are equal, round, and reactive to light.  Cardiovascular:     Rate and Rhythm: Normal rate and regular rhythm.     Pulses: Normal pulses.  Pulmonary:     Effort: Pulmonary effort is normal.  Abdominal:     General: Abdomen is flat.  Skin:    General: Skin is warm.  Neurological:  General: No focal deficit present.     Mental Status: He is alert.     ED Results / Procedures / Treatments   Labs (all labs ordered are listed, but only abnormal results are displayed) Labs Reviewed  CBC WITH DIFFERENTIAL/PLATELET  COMPREHENSIVE METABOLIC PANEL  URINALYSIS, ROUTINE W REFLEX MICROSCOPIC    EKG EKG Interpretation  Date/Time:  Monday August 02 2022 13:57:28 EDT Ventricular Rate:  71 PR Interval:  245 QRS Duration: 91 QT Interval:  413 QTC Calculation: 449 R Axis:   51 Text Interpretation: Sinus rhythm Atrial premature complex Prolonged PR interval Confirmed by Alvino Blood ([id number]) on 08/02/2022 2:30:15 PM  Radiology No results found.  Procedures Procedures     Medications Ordered in ED Medications - No data to display  ED Course/ Medical Decision Making/ A&P                             Medical Decision Making Patient is reported to have had 1 chest progression after becoming unresponsive at nursing home.  Amount and/or Complexity of Data Reviewed Independent Historian: EMS    Details: Per EMS and nursing home Labs: ordered. Decision-making details documented in ED Course.    Details: Labs ordered Radiology: ordered.    Details: X-ray ordered ECG/medicine tests: ordered and independent interpretation performed. Decision-making details documented in ED Course.    Details: EKG ordered and reviewed no acute changes   Patient's care turned over to oncoming provider        Final Clinical Impression(s) / ED Diagnoses Final diagnoses:  None    Rx / DC Orders ED Discharge Orders     None         Osie Cheeks 08/02/22 1442    Lonell Grandchild, MD 08/03/22 [phone]

## 2022-08-02 NOTE — ED Notes (Signed)
Family updated as to patient's status.

## 2023-01-05 ENCOUNTER — Ambulatory Visit: Payer: Medicare Other | Admitting: Neurology

## 2023-02-10 ENCOUNTER — Encounter: Payer: Self-pay | Admitting: Neurology

## 2023-02-10 ENCOUNTER — Ambulatory Visit (INDEPENDENT_AMBULATORY_CARE_PROVIDER_SITE_OTHER): Payer: Medicare Other | Admitting: Neurology

## 2023-02-10 VITALS — BP 127/71 | HR 80 | Resp 16 | Wt 240.0 lb

## 2023-02-10 DIAGNOSIS — R4701 Aphasia: Secondary | ICD-10-CM | POA: Diagnosis not present

## 2023-02-10 DIAGNOSIS — F03C0 Unspecified dementia, severe, without behavioral disturbance, psychotic disturbance, mood disturbance, and anxiety: Secondary | ICD-10-CM | POA: Diagnosis not present

## 2023-02-10 DIAGNOSIS — R569 Unspecified convulsions: Secondary | ICD-10-CM

## 2023-02-10 DIAGNOSIS — Z5181 Encounter for therapeutic drug level monitoring: Secondary | ICD-10-CM | POA: Diagnosis not present

## 2023-02-10 NOTE — Patient Instructions (Signed)
Continue current medications  Will check Keppra, Trileptal levels and CMP  Continue to follow up PCP  Return as needed

## 2023-02-10 NOTE — Progress Notes (Signed)
[facility] g PATIENT: Christopher Hobbs DOB: 06/28/1940  REQUESTING CLINICIAN: Merrilyn Puma, DO HISTORY FROM: chart review  REASON FOR VISIT: Seizure    HISTORICAL  CHIEF COMPLAINT:  Chief Complaint  Patient presents with   New Patient (Initial Visit)    Rm12, cna/transporter present, traumatic seizures: called Paschal the rn at [facility] stated he becomes unresponsive and he cannot come to even with wet cloths, chest rub, like deep sleep. Then eventually he comes out of it and becomes normal and will respond with actions. Non convulsive seems to be frozen for 15-20 mins at a time. Only happened once in 12 months.     HISTORY OF PRESENT ILLNESS:  This 82 year old years old gentleman with past medical history of hypertension, hyperlipidemia, seizures, severe dementia, total dependence, nonverbal who is presenting with CNA/transport for management of the seizure.  Patient's transport does not work with him, unable to provide any history.  Patient is nonverbal, unable to provide any history.  Limited paperwork indicates that patient has severe dementia, nonverbal at baseline and he had seizure-like symptom on June 23, there was no convulsion noted at that time, lab work indicated also that he had a UTI and patient was started on Keflex. He is on Keppra and Trileptal for his seizures.  We will reach out to the facility to obtain additional history.  OTHER MEDICAL CONDITIONS: Severe dementia, nonverbal, seizure, hypertension, hyperlipidemia   REVIEW OF SYSTEMS: Full 14 system review of systems performed and negative with exception of: Unable to obtain due to severe dementia, and nonverbal patient.   ALLERGIES: No Known Allergies  HOME MEDICATIONS: Outpatient Medications Prior to Visit  Medication Sig Dispense Refill   Cholecalciferol (VITAMIN D3) 1.25 MG (50000 UT) TABS Take 1.25 mg by mouth once a week. Saturday     fluticasone (FLONASE) 50 MCG/ACT nasal spray  Place 1 spray into both nostrils 2 (two) times daily.     furosemide (LASIX) 40 MG tablet Take 40 mg by mouth daily.     levETIRAcetam (KEPPRA) 500 MG tablet Take 1 tablet (500 mg total) by mouth 2 (two) times daily. (Patient taking differently: Take 500 mg by mouth at bedtime.) 60 tablet 0   lisinopril (ZESTRIL) 10 MG tablet Take 10 mg by mouth daily.     Melatonin 3 MG TABS Take 6 mg by mouth at bedtime.     OXcarbazepine (TRILEPTAL) 150 MG tablet Take 150 mg by mouth at bedtime.     Oxcarbazepine (TRILEPTAL) 300 MG tablet Take 300 mg by mouth daily. Stable mood     vitamin B-12 (CYANOCOBALAMIN) 1000 MCG tablet Take 1,000 mcg by mouth daily.     montelukast (SINGULAIR) 10 MG tablet Take 10 mg by mouth at bedtime.     PARoxetine (PAXIL) 10 MG tablet Take 10 mg by mouth daily.     Skin Protectants, Misc. (EUCERIN) cream Apply 1 application topically 2 (two) times daily.     No facility-administered medications prior to visit.    PAST MEDICAL HISTORY: Past Medical History:  Diagnosis Date   Dementia (HCC)    Seizures (HCC)     PAST SURGICAL HISTORY: History reviewed. No pertinent surgical history.  FAMILY HISTORY: History reviewed. No pertinent family history.  SOCIAL HISTORY: Social History   Socioeconomic History   Marital status: Divorced    Spouse name: Not on file   Number of children: Not on file   Years of education: Not on file   Highest education level:  Not on file  Occupational History   Not on file  Tobacco Use   Smoking status: Never   Smokeless tobacco: Never  Vaping Use   Vaping status: Never Used  Substance and Sexual Activity   Alcohol use: Not Currently   Drug use: Not Currently   Sexual activity: Not Currently  Other Topics Concern   Not on file  Social History Narrative   Not on file   Social Determinants of Health   Financial Resource Strain: Not on file  Food Insecurity: Not on file  Transportation Needs: Not on file  Physical Activity:  Not on file  Stress: Not on file  Social Connections: Not on file  Intimate Partner Violence: Not on file    PHYSICAL EXAM  GENERAL EXAM/CONSTITUTIONAL: Vitals:  Vitals:   02/10/23 0800  BP: 127/71  Pulse: 80  Resp: 16  Weight: 240 lb (108.9 kg)   Body mass index is 32.55 kg/m. Wt Readings from Last 3 Encounters:  02/10/23 240 lb (108.9 kg)  07/19/21 240 lb (108.9 kg)   Patient is in no distress; well developed, nourished and groomed; neck is supple  MUSCULOSKELETAL: Gait, strength, tone, movements noted in Neurologic exam below  NEUROLOGIC: MENTAL STATUS:      No data to display         awake, alert, non verbal and non responsive.  Sitting in his wheelchair  Able to track examiner and blink to threat bilaterally     DIAGNOSTIC DATA (LABS, IMAGING, TESTING) - I reviewed patient records, labs, notes, testing and imaging myself where available.  Lab Results  Component Value Date   WBC 6.9 08/02/2022   HGB 11.6 (L) 08/02/2022   HCT 37.2 (L) 08/02/2022   MCV 88.2 08/02/2022   PLT 218 08/02/2022      Component Value Date/Time   NA 140 08/02/2022 1409   K 3.5 08/02/2022 1409   CL 106 08/02/2022 1409   CO2 27 08/02/2022 1409   GLUCOSE 114 (H) 08/02/2022 1409   BUN 20 08/02/2022 1409   CREATININE 1.12 08/02/2022 1409   CALCIUM 9.4 08/02/2022 1409   PROT 7.2 08/02/2022 1409   ALBUMIN 3.1 (L) 08/02/2022 1409   AST 18 08/02/2022 1409   ALT 10 08/02/2022 1409   ALKPHOS 593 (H) 08/02/2022 1409   BILITOT 0.5 08/02/2022 1409   GFRNONAA >60 08/02/2022 1409   GFRAA >60 08/16/2017 2057   No results found for: "CHOL", "HDL", "LDLCALC", "LDLDIRECT", "TRIG", "CHOLHDL" No results found for: "HGBA1C" No results found for: "VITAMINB12" No results found for: "TSH"  CT Head 08/16/2017 1. No acute intracranial process. 2. Moderate to severe atrophy with disproportionate mesial temporal lobe volume loss associated with neurodegenerative syndromes. 3. Mild to  moderate chronic small vessel ischemic disease.    ASSESSMENT AND PLAN  82 y.o. year old male with hypertension, hyperlipidemia, seizures, severe dementia, total dependence, nonverbal who is presenting for management of the of his seizure.  Per chart review patient had a seizure-like activity in June but at the same time was found to have a UTI.  He is on Keppra and Trileptal.  Unable to obtain any history.   Called Paschal the RN at [facility] who stated he becomes unresponsive and he cannot come to even with wet cloths, chest rub, like deep sleep. Then eventually he comes out of it and becomes normal and will respond with actions. Non convulsion, seems to be frozen for 15-20 mins at a time. Only happened once in  12 months.  Plan for patient is to continue his current anti seizure medications and to continue follow-up with facility's medical doctor. I will check his levels and CMP.    1. Seizures (HCC)   2. Severe dementia, unspecified dementia type, unspecified whether behavioral, psychotic, or mood disturbance or anxiety (HCC)   3. Nonverbal   4. Therapeutic drug monitoring      Patient Instructions  Continue current medications  Will check Keppra, Trileptal levels and CMP  Continue to follow up PCP  Return as needed   Orders Placed This Encounter  Procedures   Levetiracetam level   10-Hydroxycarbazepine   CMP    No orders of the defined types were placed in this encounter.   Return if symptoms worsen or fail to improve.    Windell Norfolk, MD 02/10/2023, 8:27 AM  [facility] [address] [place], [place] [address] [phone]

## 2023-05-19 ENCOUNTER — Emergency Department (HOSPITAL_COMMUNITY): Payer: Medicare Other

## 2023-05-19 ENCOUNTER — Inpatient Hospital Stay (HOSPITAL_COMMUNITY)
Admission: EM | Admit: 2023-05-19 | Discharge: 2023-05-31 | DRG: 640 | Disposition: A | Discharge status: Skilled Nursing Facility | Payer: Medicare Other | Attending: Internal Medicine | Admitting: Internal Medicine

## 2023-05-19 DIAGNOSIS — Z66 Do not resuscitate: Secondary | ICD-10-CM | POA: Diagnosis present

## 2023-05-19 DIAGNOSIS — E870 Hyperosmolality and hypernatremia: Secondary | ICD-10-CM | POA: Diagnosis present

## 2023-05-19 DIAGNOSIS — E860 Dehydration: Principal | ICD-10-CM | POA: Diagnosis present

## 2023-05-19 DIAGNOSIS — Z8249 Family history of ischemic heart disease and other diseases of the circulatory system: Secondary | ICD-10-CM

## 2023-05-19 DIAGNOSIS — K5641 Fecal impaction: Secondary | ICD-10-CM | POA: Diagnosis present

## 2023-05-19 DIAGNOSIS — Z1152 Encounter for screening for COVID-19: Secondary | ICD-10-CM

## 2023-05-19 DIAGNOSIS — R627 Adult failure to thrive: Secondary | ICD-10-CM | POA: Diagnosis present

## 2023-05-19 DIAGNOSIS — N179 Acute kidney failure, unspecified: Principal | ICD-10-CM | POA: Diagnosis present

## 2023-05-19 DIAGNOSIS — F03C0 Unspecified dementia, severe, without behavioral disturbance, psychotic disturbance, mood disturbance, and anxiety: Secondary | ICD-10-CM | POA: Diagnosis present

## 2023-05-19 DIAGNOSIS — G40909 Epilepsy, unspecified, not intractable, without status epilepticus: Secondary | ICD-10-CM | POA: Diagnosis present

## 2023-05-19 DIAGNOSIS — E66811 Obesity, class 1: Secondary | ICD-10-CM | POA: Diagnosis present

## 2023-05-19 DIAGNOSIS — I10 Essential (primary) hypertension: Secondary | ICD-10-CM | POA: Diagnosis present

## 2023-05-19 DIAGNOSIS — Z79899 Other long term (current) drug therapy: Secondary | ICD-10-CM

## 2023-05-19 DIAGNOSIS — D638 Anemia in other chronic diseases classified elsewhere: Secondary | ICD-10-CM | POA: Diagnosis present

## 2023-05-19 DIAGNOSIS — K5289 Other specified noninfective gastroenteritis and colitis: Secondary | ICD-10-CM | POA: Diagnosis present

## 2023-05-19 DIAGNOSIS — Z515 Encounter for palliative care: Secondary | ICD-10-CM

## 2023-05-19 DIAGNOSIS — N390 Urinary tract infection, site not specified: Secondary | ICD-10-CM | POA: Diagnosis present

## 2023-05-19 DIAGNOSIS — Z6832 Body mass index (BMI) 32.0-32.9, adult: Secondary | ICD-10-CM

## 2023-05-19 DIAGNOSIS — I4891 Unspecified atrial fibrillation: Secondary | ICD-10-CM | POA: Diagnosis not present

## 2023-05-19 DIAGNOSIS — I493 Ventricular premature depolarization: Secondary | ICD-10-CM | POA: Diagnosis not present

## 2023-05-19 DIAGNOSIS — R4182 Altered mental status, unspecified: Secondary | ICD-10-CM | POA: Diagnosis not present

## 2023-05-19 DIAGNOSIS — B9620 Unspecified Escherichia coli [E. coli] as the cause of diseases classified elsewhere: Secondary | ICD-10-CM | POA: Diagnosis present

## 2023-05-19 DIAGNOSIS — I959 Hypotension, unspecified: Secondary | ICD-10-CM | POA: Diagnosis present

## 2023-05-19 DIAGNOSIS — G9341 Metabolic encephalopathy: Secondary | ICD-10-CM | POA: Diagnosis present

## 2023-05-19 DIAGNOSIS — Z833 Family history of diabetes mellitus: Secondary | ICD-10-CM

## 2023-05-19 DIAGNOSIS — E872 Acidosis, unspecified: Secondary | ICD-10-CM | POA: Diagnosis present

## 2023-05-19 DIAGNOSIS — E876 Hypokalemia: Secondary | ICD-10-CM | POA: Diagnosis present

## 2023-05-19 DIAGNOSIS — I471 Supraventricular tachycardia, unspecified: Secondary | ICD-10-CM | POA: Diagnosis not present

## 2023-05-19 LAB — CBC WITH DIFFERENTIAL/PLATELET
Abs Immature Granulocytes: 0.03 10*3/uL (ref 0.00–0.07)
Basophils Absolute: 0 10*3/uL (ref 0.0–0.1)
Basophils Relative: 0 %
Eosinophils Absolute: 0 10*3/uL (ref 0.0–0.5)
Eosinophils Relative: 0 %
HCT: 33.4 % — ABNORMAL LOW (ref 39.0–52.0)
Hemoglobin: 9.8 g/dL — ABNORMAL LOW (ref 13.0–17.0)
Immature Granulocytes: 0 %
Lymphocytes Relative: 19 %
Lymphs Abs: 1.9 10*3/uL (ref 0.7–4.0)
MCH: 28 pg (ref 26.0–34.0)
MCHC: 29.3 g/dL — ABNORMAL LOW (ref 30.0–36.0)
MCV: 95.4 fL (ref 80.0–100.0)
Monocytes Absolute: 1.2 10*3/uL — ABNORMAL HIGH (ref 0.1–1.0)
Monocytes Relative: 12 %
Neutro Abs: 6.6 10*3/uL (ref 1.7–7.7)
Neutrophils Relative %: 69 %
Platelets: 152 10*3/uL (ref 150–400)
RBC: 3.5 MIL/uL — ABNORMAL LOW (ref 4.22–5.81)
RDW: 15.4 % (ref 11.5–15.5)
Smear Review: NORMAL
WBC: 9.7 10*3/uL (ref 4.0–10.5)
nRBC: 0 % (ref 0.0–0.2)

## 2023-05-19 LAB — PROTIME-INR
INR: 1.4 — ABNORMAL HIGH (ref 0.8–1.2)
Prothrombin Time: 17.7 s — ABNORMAL HIGH (ref 11.4–15.2)

## 2023-05-19 LAB — COMPREHENSIVE METABOLIC PANEL
ALT: 18 U/L (ref 0–44)
AST: 25 U/L (ref 15–41)
Albumin: 2.2 g/dL — ABNORMAL LOW (ref 3.5–5.0)
Alkaline Phosphatase: 316 U/L — ABNORMAL HIGH (ref 38–126)
Anion gap: 9 (ref 5–15)
BUN: 64 mg/dL — ABNORMAL HIGH (ref 8–23)
CO2: 24 mmol/L (ref 22–32)
Calcium: 8.2 mg/dL — ABNORMAL LOW (ref 8.9–10.3)
Chloride: 121 mmol/L — ABNORMAL HIGH (ref 98–111)
Creatinine, Ser: 3.06 mg/dL — ABNORMAL HIGH (ref 0.61–1.24)
GFR, Estimated: 20 mL/min — ABNORMAL LOW (ref >60)
Glucose, Bld: 211 mg/dL — ABNORMAL HIGH (ref 70–99)
Potassium: 3.2 mmol/L — ABNORMAL LOW (ref 3.5–5.1)
Sodium: 154 mmol/L — ABNORMAL HIGH (ref 135–145)
Total Bilirubin: 0.6 mg/dL (ref 0.0–1.2)
Total Protein: 5.8 g/dL — ABNORMAL LOW (ref 6.5–8.1)

## 2023-05-19 LAB — I-STAT CG4 LACTIC ACID, ED: Lactic Acid, Venous: 2.9 mmol/L (ref 0.5–1.9)

## 2023-05-19 LAB — RESP PANEL BY RT-PCR (RSV, FLU A&B, COVID)  RVPGX2
Influenza A by PCR: NEGATIVE
Influenza B by PCR: NEGATIVE
Resp Syncytial Virus by PCR: NEGATIVE
SARS Coronavirus 2 by RT PCR: NEGATIVE

## 2023-05-19 LAB — APTT: aPTT: 33 s (ref 24–36)

## 2023-05-19 MED ORDER — SODIUM CHLORIDE 0.9 % IV BOLUS
3000.0000 mL | Freq: Once | INTRAVENOUS | Status: AC
  Administered 2023-05-19: 3000 mL via INTRAVENOUS

## 2023-05-19 MED ORDER — POTASSIUM CHLORIDE 10 MEQ/100ML IV SOLN
10.0000 meq | INTRAVENOUS | Status: AC
  Administered 2023-05-20 (×3): 10 meq via INTRAVENOUS
  Filled 2023-05-19 (×3): qty 100

## 2023-05-19 NOTE — ED Triage Notes (Signed)
Patient BIB [facility] for Hypotension and altered mental status. Facility noted decline in the past week. Dementia hx, responsive to pain. Nonambulatory.  80/50 prior to 500 ml Saline bolus by EMS. 20G R AC. 90/58 recheck.  348 CBG

## 2023-05-19 NOTE — ED Notes (Signed)
Attempted straight catheterization for urinalysis without success.

## 2023-05-19 NOTE — ED Notes (Signed)
[facility]or[facility] Hals Niece [phone] req update on pt and if he will be admitted

## 2023-05-19 NOTE — ED Notes (Signed)
Hard stick unsuccessful, was able to obtain one set of cultures and blood while assisting the Dr with an ultrasound IV

## 2023-05-20 ENCOUNTER — Inpatient Hospital Stay (HOSPITAL_COMMUNITY): Payer: Medicare Other

## 2023-05-20 ENCOUNTER — Encounter (HOSPITAL_COMMUNITY): Payer: Self-pay | Admitting: Internal Medicine

## 2023-05-20 ENCOUNTER — Other Ambulatory Visit: Payer: Self-pay

## 2023-05-20 DIAGNOSIS — E872 Acidosis, unspecified: Secondary | ICD-10-CM | POA: Diagnosis present

## 2023-05-20 DIAGNOSIS — E876 Hypokalemia: Secondary | ICD-10-CM | POA: Diagnosis present

## 2023-05-20 DIAGNOSIS — A419 Sepsis, unspecified organism: Secondary | ICD-10-CM | POA: Diagnosis not present

## 2023-05-20 DIAGNOSIS — R627 Adult failure to thrive: Secondary | ICD-10-CM | POA: Diagnosis present

## 2023-05-20 DIAGNOSIS — D638 Anemia in other chronic diseases classified elsewhere: Secondary | ICD-10-CM | POA: Diagnosis present

## 2023-05-20 DIAGNOSIS — I4891 Unspecified atrial fibrillation: Secondary | ICD-10-CM | POA: Diagnosis not present

## 2023-05-20 DIAGNOSIS — F0390 Unspecified dementia without behavioral disturbance: Secondary | ICD-10-CM | POA: Diagnosis not present

## 2023-05-20 DIAGNOSIS — K5641 Fecal impaction: Secondary | ICD-10-CM | POA: Diagnosis present

## 2023-05-20 DIAGNOSIS — F03C0 Unspecified dementia, severe, without behavioral disturbance, psychotic disturbance, mood disturbance, and anxiety: Secondary | ICD-10-CM | POA: Diagnosis present

## 2023-05-20 DIAGNOSIS — Z6832 Body mass index (BMI) 32.0-32.9, adult: Secondary | ICD-10-CM | POA: Diagnosis not present

## 2023-05-20 DIAGNOSIS — E860 Dehydration: Secondary | ICD-10-CM | POA: Diagnosis present

## 2023-05-20 DIAGNOSIS — L98499 Non-pressure chronic ulcer of skin of other sites with unspecified severity: Secondary | ICD-10-CM

## 2023-05-20 DIAGNOSIS — R569 Unspecified convulsions: Secondary | ICD-10-CM

## 2023-05-20 DIAGNOSIS — N179 Acute kidney failure, unspecified: Secondary | ICD-10-CM | POA: Diagnosis present

## 2023-05-20 DIAGNOSIS — R4182 Altered mental status, unspecified: Secondary | ICD-10-CM | POA: Diagnosis present

## 2023-05-20 DIAGNOSIS — B9620 Unspecified Escherichia coli [E. coli] as the cause of diseases classified elsewhere: Secondary | ICD-10-CM | POA: Diagnosis present

## 2023-05-20 DIAGNOSIS — I10 Essential (primary) hypertension: Secondary | ICD-10-CM | POA: Diagnosis present

## 2023-05-20 DIAGNOSIS — N390 Urinary tract infection, site not specified: Secondary | ICD-10-CM | POA: Diagnosis present

## 2023-05-20 DIAGNOSIS — Z1152 Encounter for screening for COVID-19: Secondary | ICD-10-CM | POA: Diagnosis not present

## 2023-05-20 DIAGNOSIS — Z515 Encounter for palliative care: Secondary | ICD-10-CM | POA: Diagnosis not present

## 2023-05-20 DIAGNOSIS — Z66 Do not resuscitate: Secondary | ICD-10-CM | POA: Diagnosis present

## 2023-05-20 DIAGNOSIS — L089 Local infection of the skin and subcutaneous tissue, unspecified: Secondary | ICD-10-CM

## 2023-05-20 DIAGNOSIS — I471 Supraventricular tachycardia, unspecified: Secondary | ICD-10-CM | POA: Diagnosis not present

## 2023-05-20 DIAGNOSIS — G40909 Epilepsy, unspecified, not intractable, without status epilepticus: Secondary | ICD-10-CM | POA: Diagnosis present

## 2023-05-20 DIAGNOSIS — Z7189 Other specified counseling: Secondary | ICD-10-CM | POA: Diagnosis not present

## 2023-05-20 DIAGNOSIS — G9341 Metabolic encephalopathy: Secondary | ICD-10-CM | POA: Diagnosis present

## 2023-05-20 DIAGNOSIS — K5289 Other specified noninfective gastroenteritis and colitis: Secondary | ICD-10-CM | POA: Diagnosis present

## 2023-05-20 DIAGNOSIS — E870 Hyperosmolality and hypernatremia: Secondary | ICD-10-CM | POA: Diagnosis present

## 2023-05-20 DIAGNOSIS — I959 Hypotension, unspecified: Secondary | ICD-10-CM | POA: Diagnosis present

## 2023-05-20 LAB — URINALYSIS, W/ REFLEX TO CULTURE (INFECTION SUSPECTED)
Bilirubin Urine: NEGATIVE
Bilirubin Urine: NEGATIVE
Glucose, UA: NEGATIVE mg/dL
Glucose, UA: NEGATIVE mg/dL
Ketones, ur: NEGATIVE mg/dL
Ketones, ur: NEGATIVE mg/dL
Leukocytes,Ua: NEGATIVE
Leukocytes,Ua: NEGATIVE
Nitrite: NEGATIVE
Nitrite: NEGATIVE
Protein, ur: 30 mg/dL — AB
Protein, ur: NEGATIVE mg/dL
Specific Gravity, Urine: 1.018 (ref 1.005–1.030)
Specific Gravity, Urine: 1.017 (ref 1.005–1.030)
pH: 5 (ref 5.0–8.0)
pH: 5 (ref 5.0–8.0)

## 2023-05-20 LAB — CBC
HCT: 33.9 % — ABNORMAL LOW (ref 39.0–52.0)
Hemoglobin: 9.9 g/dL — ABNORMAL LOW (ref 13.0–17.0)
MCH: 27.5 pg (ref 26.0–34.0)
MCHC: 29.2 g/dL — ABNORMAL LOW (ref 30.0–36.0)
MCV: 94.2 fL (ref 80.0–100.0)
Platelets: 169 10*3/uL (ref 150–400)
RBC: 3.6 MIL/uL — ABNORMAL LOW (ref 4.22–5.81)
RDW: 15.3 % (ref 11.5–15.5)
WBC: 10.5 10*3/uL (ref 4.0–10.5)
nRBC: 0 % (ref 0.0–0.2)

## 2023-05-20 LAB — HEMOGLOBIN A1C
Hgb A1c MFr Bld: 7.6 % — ABNORMAL HIGH (ref 4.8–5.6)
Mean Plasma Glucose: 171.42 mg/dL

## 2023-05-20 LAB — LACTIC ACID, PLASMA
Lactic Acid, Venous: 2 mmol/L (ref 0.5–1.9)
Lactic Acid, Venous: 2.1 mmol/L (ref 0.5–1.9)
Lactic Acid, Venous: 1.8 mmol/L (ref 0.5–1.9)

## 2023-05-20 LAB — RENAL FUNCTION PANEL
Albumin: 2.3 g/dL — ABNORMAL LOW (ref 3.5–5.0)
Albumin: 2.2 g/dL — ABNORMAL LOW (ref 3.5–5.0)
Anion gap: 10 (ref 5–15)
Anion gap: 10 (ref 5–15)
BUN: 51 mg/dL — ABNORMAL HIGH (ref 8–23)
BUN: 45 mg/dL — ABNORMAL HIGH (ref 8–23)
CO2: 24 mmol/L (ref 22–32)
CO2: 21 mmol/L — ABNORMAL LOW (ref 22–32)
Calcium: 9.2 mg/dL (ref 8.9–10.3)
Calcium: 9.1 mg/dL (ref 8.9–10.3)
Chloride: 121 mmol/L — ABNORMAL HIGH (ref 98–111)
Chloride: 123 mmol/L — ABNORMAL HIGH (ref 98–111)
Creatinine, Ser: 1.76 mg/dL — ABNORMAL HIGH (ref 0.61–1.24)
Creatinine, Ser: 1.58 mg/dL — ABNORMAL HIGH (ref 0.61–1.24)
GFR, Estimated: 38 mL/min — ABNORMAL LOW (ref >60)
GFR, Estimated: 44 mL/min — ABNORMAL LOW (ref >60)
Glucose, Bld: 192 mg/dL — ABNORMAL HIGH (ref 70–99)
Glucose, Bld: 160 mg/dL — ABNORMAL HIGH (ref 70–99)
Phosphorus: 2.5 mg/dL (ref 2.5–4.6)
Phosphorus: 2.2 mg/dL — ABNORMAL LOW (ref 2.5–4.6)
Potassium: 3.4 mmol/L — ABNORMAL LOW (ref 3.5–5.1)
Potassium: 3.1 mmol/L — ABNORMAL LOW (ref 3.5–5.1)
Sodium: 155 mmol/L — ABNORMAL HIGH (ref 135–145)
Sodium: 154 mmol/L — ABNORMAL HIGH (ref 135–145)

## 2023-05-20 LAB — COMPREHENSIVE METABOLIC PANEL
ALT: 18 U/L (ref 0–44)
AST: 27 U/L (ref 15–41)
Albumin: 2.4 g/dL — ABNORMAL LOW (ref 3.5–5.0)
Alkaline Phosphatase: 349 U/L — ABNORMAL HIGH (ref 38–126)
Anion gap: 11 (ref 5–15)
BUN: 63 mg/dL — ABNORMAL HIGH (ref 8–23)
CO2: 22 mmol/L (ref 22–32)
Calcium: 9.1 mg/dL (ref 8.9–10.3)
Chloride: 122 mmol/L — ABNORMAL HIGH (ref 98–111)
Creatinine, Ser: 2.42 mg/dL — ABNORMAL HIGH (ref 0.61–1.24)
GFR, Estimated: 26 mL/min — ABNORMAL LOW (ref >60)
Glucose, Bld: 218 mg/dL — ABNORMAL HIGH (ref 70–99)
Potassium: 3.9 mmol/L (ref 3.5–5.1)
Sodium: 155 mmol/L — ABNORMAL HIGH (ref 135–145)
Total Bilirubin: 0.6 mg/dL (ref 0.0–1.2)
Total Protein: 6.7 g/dL (ref 6.5–8.1)

## 2023-05-20 LAB — BLOOD GAS, VENOUS
Acid-Base Excess: 0.5 mmol/L (ref 0.0–2.0)
Bicarbonate: 27 mmol/L (ref 20.0–28.0)
O2 Saturation: 58.6 %
Patient temperature: 37
pCO2, Ven: 50 mm[Hg] (ref 44–60)
pH, Ven: 7.34 (ref 7.25–7.43)
pO2, Ven: 36 mm[Hg] (ref 32–45)

## 2023-05-20 LAB — I-STAT CG4 LACTIC ACID, ED: Lactic Acid, Venous: 2.8 mmol/L (ref 0.5–1.9)

## 2023-05-20 LAB — CK: Total CK: 866 U/L — ABNORMAL HIGH (ref 49–397)

## 2023-05-20 LAB — PROCALCITONIN: Procalcitonin: 0.25 ng/mL

## 2023-05-20 LAB — MAGNESIUM: Magnesium: 1.8 mg/dL (ref 1.7–2.4)

## 2023-05-20 LAB — TSH: TSH: 0.492 u[IU]/mL (ref 0.350–4.500)

## 2023-05-20 LAB — C-REACTIVE PROTEIN: CRP: 12.2 mg/dL — ABNORMAL HIGH (ref <1.0)

## 2023-05-20 MED ORDER — FLEET ENEMA RE ENEM
1.0000 | ENEMA | Freq: Once | RECTAL | Status: AC
  Administered 2023-05-20: 1 via RECTAL
  Filled 2023-05-20: qty 1

## 2023-05-20 MED ORDER — ONDANSETRON HCL 4 MG PO TABS: 4.0000 mg | ORAL_TABLET | Freq: Four times a day (QID) | ORAL | Status: DC | PRN

## 2023-05-20 MED ORDER — VANCOMYCIN HCL 750 MG/150ML IV SOLN: 750.0000 mg | INTRAVENOUS | Status: DC

## 2023-05-20 MED ORDER — SODIUM CHLORIDE 0.9 % IV SOLN
1.0000 g | INTRAVENOUS | Status: DC
  Administered 2023-05-21 – 2023-05-24 (×4): 1 g via INTRAVENOUS
  Filled 2023-05-20 (×4): qty 10

## 2023-05-20 MED ORDER — LEVETIRACETAM IN NACL 500 MG/100ML IV SOLN
500.0000 mg | Freq: Two times a day (BID) | INTRAVENOUS | Status: DC
  Administered 2023-05-20 – 2023-05-24 (×9): 500 mg via INTRAVENOUS
  Filled 2023-05-20 (×9): qty 100

## 2023-05-20 MED ORDER — SODIUM CHLORIDE 0.45 % IV SOLN: INTRAVENOUS | Status: DC

## 2023-05-20 MED ORDER — SODIUM CHLORIDE 0.45 % IV BOLUS
1000.0000 mL | INTRAVENOUS | Status: AC
  Administered 2023-05-20: 1000 mL via INTRAVENOUS

## 2023-05-20 MED ORDER — HEPARIN SODIUM (PORCINE) 5000 UNIT/ML IJ SOLN
5000.0000 [IU] | Freq: Three times a day (TID) | INTRAMUSCULAR | Status: DC
  Administered 2023-05-20 – 2023-05-26 (×18): 5000 [IU] via SUBCUTANEOUS
  Filled 2023-05-20 (×18): qty 1

## 2023-05-20 MED ORDER — ACETAMINOPHEN 650 MG RE SUPP
650.0000 mg | Freq: Four times a day (QID) | RECTAL | Status: DC | PRN
Start: 2023-05-20 — End: 2023-05-31
  Administered 2023-05-20: 650 mg via RECTAL
  Filled 2023-05-20: qty 1

## 2023-05-20 MED ORDER — FREE WATER
200.0000 mL | Status: DC
  Administered 2023-05-20 (×2): 200 mL

## 2023-05-20 MED ORDER — ACETAMINOPHEN 325 MG PO TABS
650.0000 mg | ORAL_TABLET | Freq: Four times a day (QID) | ORAL | Status: DC | PRN
  Administered 2023-05-20: 650 mg via ORAL
  Filled 2023-05-20: qty 2

## 2023-05-20 MED ORDER — LEVETIRACETAM 500 MG PO TABS
500.0000 mg | ORAL_TABLET | Freq: Two times a day (BID) | ORAL | Status: DC
  Filled 2023-05-20: qty 1

## 2023-05-20 MED ORDER — ONDANSETRON HCL 4 MG/2ML IJ SOLN
4.0000 mg | Freq: Four times a day (QID) | INTRAMUSCULAR | Status: DC | PRN
  Filled 2023-05-20: qty 2

## 2023-05-20 MED ORDER — VANCOMYCIN HCL 750 MG IV SOLR
750.0000 mg | INTRAVENOUS | Status: DC
  Administered 2023-05-21 – 2023-05-22 (×2): 750 mg via INTRAVENOUS
  Filled 2023-05-20 (×2): qty 15

## 2023-05-20 MED ORDER — SODIUM CHLORIDE 0.9 % IV SOLN
1.0000 g | Freq: Once | INTRAVENOUS | Status: AC
  Administered 2023-05-20: 1 g via INTRAVENOUS
  Filled 2023-05-20: qty 10

## 2023-05-20 MED ORDER — VANCOMYCIN HCL 2000 MG/400ML IV SOLN
2000.0000 mg | Freq: Once | INTRAVENOUS | Status: AC
  Administered 2023-05-20: 2000 mg via INTRAVENOUS
  Filled 2023-05-20: qty 400

## 2023-05-20 MED ORDER — SODIUM CHLORIDE 0.9 % IV BOLUS
500.0000 mL | Freq: Once | INTRAVENOUS | Status: AC
  Administered 2023-05-20: 500 mL via INTRAVENOUS

## 2023-05-20 MED ORDER — OXCARBAZEPINE 300 MG PO TABS
300.0000 mg | ORAL_TABLET | Freq: Every day | ORAL | Status: DC
  Administered 2023-05-21 – 2023-05-31 (×8): 300 mg via ORAL
  Filled 2023-05-20 (×11): qty 1

## 2023-05-20 MED ORDER — OXCARBAZEPINE 150 MG PO TABS
150.0000 mg | ORAL_TABLET | Freq: Every day | ORAL | Status: DC
Start: 2023-05-20 — End: 2023-05-31
  Administered 2023-05-24 – 2023-05-30 (×6): 150 mg via ORAL
  Filled 2023-05-20 (×13): qty 1

## 2023-05-20 NOTE — Progress Notes (Signed)
PROGRESS NOTE    Christopher Hobbs  WUJ:[id number] DOB: 30-Sep-1940 DOA: 05/19/2023 PCP: Georgann Housekeeper, MD  Outpatient Specialists:     Brief Narrative:  Patient is an 83 year old male past medical history significant for dementia and seizure.  Patient was admitted with altered mental status, hypotension, acute kidney injury, dehydration with sodium of 155 (Free water deficit of at least 5.83 L) and possible UTI (UA revealed many bacteria, moderate hematuria but negative leukocyte).  Urine and blood cultures are pending.  Patient is currently on IV vancomycin and Rocephin.    05/20/2023: Patient seen alongside patient's niece and niece's daughter.  Patient is minimally responsive.  Patient's niece confirms that this is patient's baseline.  No significant history from patient.  Patient is a skilled nursing facility resident.  Intermittent hypotension is noted.   Assessment & Plan:   Principal Problem:   AKI (acute kidney injury) (HCC)  Acute kidney injury: -Likely secondary to volume depletion. -Cannot rule out ATN. -Baseline serum creatinine of 1.12. -Patient was admitted with serum creatinine of 3.06. -With hydration, serum creatinine has improved to 2.42, with BUN of 63. -Continue IVF half-normal saline at 125 cc/h. -Start free water 200 cc every 2 hours x 30 hours.   -Continue to monitor renal function and electrolytes.  Renal panel every 6 hours (patient has hypernatremia)  Dehydration/hypernatremia: -Sodium of 155. -Elevated urine specific gravity. -Patient is at least 5.83 L free water deficit. -Continue half-normal saline at 125 cc/h. -Start free water 200 cc every 2 hours. -Aim to correct sodium of 30 hours. -Renal panel every 6 hours. -Adjust hydration accordingly.  Possible UTI: -Follow urine culture. -Continue IV Rocephin.  Altered mental status: -Query chronically minimally responsive. -Manage dehydration cautiously. -Follow renal panel and, especially, sodium  level. -Follow urine and blood cultures. -Continue IV Rocephin and vancomycin.  Dementia: -Suspect very advanced.  History of seizure: -No documented seizure.  Hypotension: -Likely multifactorial. -Patient is dehydrated. -Possible UTI/SIRS -Continue antibiotics. -Continue volume repletion.  Constipation/fecal impaction: -Laxative. -Trial of enema.   DVT prophylaxis: Subcutaneous heparin. Code Status: DO NOT RESUSCITATE Family Communication: Niece and niece's daughter Disposition Plan: Likely back to skilled nursing facility.  Patient remains inpatient.  Serious and severe comorbidities.   Consultants:  None.  Procedures:  None.  Antimicrobials:  Rocephin. IV vancomycin.   Subjective:  Able to give significant history.  Objective: Vitals:   05/20/23 0958 05/20/23 1242 05/20/23 1552 05/20/23 1626  BP:  115/71 127/81 (!) 90/54  Pulse:  (!) 117 84 (!) 122  Resp:  (!) 21 (!) 21 (!) 25  Temp: 99.7 F (37.6 C) (!) 97.5 F (36.4 C) 97.7 F (36.5 C) (!) 100.4 F (38 C)  TempSrc:  Axillary Axillary Axillary  SpO2:  100% 100% 99%  Weight:      Height:        Intake/Output Summary (Last 24 hours) at 05/20/2023 1636 Last data filed at 05/20/2023 0902 Gross per 24 hour  Intake 3688.1 ml  Output 701 ml  Net 2987.1 ml   Filed Weights   05/20/23 0328  Weight: 108.9 kg    Examination:  General exam: Patient looks chronically ill looking.  Minimally responsive.   Respiratory system: Clear to auscultation.  Cardiovascular system: S1 & S2 heard Gastrointestinal system: Abdomen is soft and nontender. Central nervous system: Minimally responsive.  Data Reviewed: I have personally reviewed following labs and imaging studies  CBC: Recent Labs  Lab 05/19/23 2040 05/20/23 0441  WBC 9.7 10.5  NEUTROABS 6.6  --  HGB 9.8* 9.9*  HCT 33.4* 33.9*  MCV 95.4 94.2  PLT 152 169   Basic Metabolic Panel: Recent Labs  Lab 05/19/23 2040 05/20/23 0441  NA 154*  155*  K 3.2* 3.9  CL 121* 122*  CO2 24 22  GLUCOSE 211* 218*  BUN 64* 63*  CREATININE 3.06* 2.42*  CALCIUM 8.2* 9.1  MG  --  1.8   GFR: Estimated Creatinine Clearance: 30.5 mL/min (A) (by C-G formula based on SCr of 2.42 mg/dL (H)). Liver Function Tests: Recent Labs  Lab 05/19/23 2040 05/20/23 0441  AST 25 27  ALT 18 18  ALKPHOS 316* 349*  BILITOT 0.6 0.6  PROT 5.8* 6.7  ALBUMIN 2.2* 2.4*   No results for input(s): "LIPASE", "AMYLASE" in the last 168 hours. No results for input(s): "AMMONIA" in the last 168 hours. Coagulation Profile: Recent Labs  Lab 05/19/23 2040  INR 1.4*   Cardiac Enzymes: Recent Labs  Lab 05/20/23 0441  CKTOTAL 866*   BNP (last 3 results) No results for input(s): "PROBNP" in the last 8760 hours. HbA1C: Recent Labs    05/20/23 0441  HGBA1C 7.6*   CBG: No results for input(s): "GLUCAP" in the last 168 hours. Lipid Profile: No results for input(s): "CHOL", "HDL", "LDLCALC", "TRIG", "CHOLHDL", "LDLDIRECT" in the last 72 hours. Thyroid Function Tests: Recent Labs    05/20/23 0640  TSH 0.492   Anemia Panel: No results for input(s): "VITAMINB12", "FOLATE", "FERRITIN", "TIBC", "IRON", "RETICCTPCT" in the last 72 hours. Urine analysis:    Component Value Date/Time   COLORURINE AMBER (A) 05/20/2023 0400   APPEARANCEUR HAZY (A) 05/20/2023 0400   LABSPEC 1.017 05/20/2023 0400   PHURINE 5.0 05/20/2023 0400   GLUCOSEU NEGATIVE 05/20/2023 0400   HGBUR LARGE (A) 05/20/2023 0400   BILIRUBINUR NEGATIVE 05/20/2023 0400   KETONESUR NEGATIVE 05/20/2023 0400   PROTEINUR NEGATIVE 05/20/2023 0400   NITRITE NEGATIVE 05/20/2023 0400   LEUKOCYTESUR NEGATIVE 05/20/2023 0400   Sepsis Labs: @LABRCNTIP (procalcitonin:4,lacticidven:4)  ) Recent Results (from the past 240 hours)  Blood Culture (routine x 2)     Status: None (Preliminary result)   Collection Time: 05/19/23  8:40 PM   Specimen: BLOOD  Result Value Ref Range Status   Specimen  Description BLOOD RIGHT ANTECUBITAL  Final   Special Requests   Final    BOTTLES DRAWN AEROBIC ONLY Blood Culture results may not be optimal due to an excessive volume of blood received in culture bottles   Culture   Final    NO GROWTH < 12 HOURS Performed at [facility], [address], [place], [place] [address]    Report Status PENDING  Incomplete  Resp panel by RT-PCR (RSV, Flu A&B, Covid) Anterior Nasal Swab     Status: None   Collection Time: 05/19/23 10:30 PM   Specimen: Anterior Nasal Swab  Result Value Ref Range Status   SARS Coronavirus 2 by RT PCR NEGATIVE NEGATIVE Final   Influenza A by PCR NEGATIVE NEGATIVE Final   Influenza B by PCR NEGATIVE NEGATIVE Final    Comment: (NOTE) The Xpert Xpress SARS-CoV-2/FLU/RSV plus assay is intended as an aid in the diagnosis of influenza from Nasopharyngeal swab specimens and should not be used as a sole basis for treatment. Nasal washings and aspirates are unacceptable for Xpert Xpress SARS-CoV-2/FLU/RSV testing.  Fact Sheet for Patients: BloggerCourse.com  Fact Sheet for Healthcare Providers: SeriousBroker.it  This test is not yet approved or cleared by the [place] FDA and has been  authorized for detection and/or diagnosis of SARS-CoV-2 by FDA under an Emergency Use Authorization (EUA). This EUA will remain in effect (meaning this test can be used) for the duration of the COVID-19 declaration under Section 564(b)(1) of the Act, 21 U.S.C. section 360bbb-3(b)(1), unless the authorization is terminated or revoked.     Resp Syncytial Virus by PCR NEGATIVE NEGATIVE Final    Comment: (NOTE) Fact Sheet for Patients: BloggerCourse.com  Fact Sheet for Healthcare Providers: SeriousBroker.it  This test is not yet approved or cleared by the [place] FDA and has been authorized for detection and/or diagnosis of  SARS-CoV-2 by FDA under an Emergency Use Authorization (EUA). This EUA will remain in effect (meaning this test can be used) for the duration of the COVID-19 declaration under Section 564(b)(1) of the Act, 21 U.S.C. section 360bbb-3(b)(1), unless the authorization is terminated or revoked.  Performed at [facility], [address], [place], [place] [address]          Radiology Studies: EEG adult Result Date: 05/20/2023 Charlsie Quest, MD     05/20/2023 10:49 AM Patient Name: Ross Bender MRN: [id number] Epilepsy Attending: Charlsie Quest Referring Physician/Provider: Lurline Del, MD Date: 05/20/2023 Duration: 23.47 mins Patient history: 83yo M with ams getting eeg to evaluate for seizure Level of alertness: Awake AEDs during EEG study: LEV, OXC Technical aspects: This EEG study was done with scalp electrodes positioned according to the 10-20 International system of electrode placement. Electrical activity was reviewed with band pass filter of 1-70Hz , sensitivity of 7 uV/mm, display speed of 62mm/sec with a 60Hz  notched filter applied as appropriate. EEG data were recorded continuously and digitally stored.  Video monitoring was available and reviewed as appropriate. Description: EEG showed continuous generalized 3 to 6 Hz theta-delta slowing. Hyperventilation and photic stimulation were not performed.   ABNORMALITY - Continuous slow, generalized IMPRESSION: This study is suggestive of moderate diffuse encephalopathy. No seizures or epileptiform discharges were seen throughout the recording. Charlsie Quest   DG Ankle 2 Views Right Result Date: 05/20/2023 CLINICAL DATA:  83 year old male with history of abscess. EXAM: RIGHT ANKLE - 2 VIEW COMPARISON:  No priors. FINDINGS: Two views of the right ankle demonstrate no acute displaced fracture, subluxation or dislocation. No definite destructive bony lesions. Soft tissues are grossly unremarkable. IMPRESSION: 1. No acute radiographic  abnormality of the right ankle. Electronically Signed   By: Trudie Reed M.D.   On: 05/20/2023 06:25   CT ABDOMEN PELVIS WO CONTRAST Result Date: 05/20/2023 CLINICAL DATA:  Abdominal pain. EXAM: CT ABDOMEN AND PELVIS WITHOUT CONTRAST TECHNIQUE: Multidetector CT imaging of the abdomen and pelvis was performed following the standard protocol without IV contrast. RADIATION DOSE REDUCTION: This exam was performed according to the departmental dose-optimization program which includes automated exposure control, adjustment of the mA and/or kV according to patient size and/or use of iterative reconstruction technique. COMPARISON:  None Available. FINDINGS: Lower chest: There is atelectasis in the lung bases. Hepatobiliary: There is a 6.9 by 6.4 cm hypodense lesion in the right lobe of the liver measuring 9 Hounsfield units. The gallbladder and bile ducts are within normal limits. Pancreas: Unremarkable. No pancreatic ductal dilatation or surrounding inflammatory changes. Spleen: Normal in size without focal abnormality. Adrenals/Urinary Tract: There is a punctate nonobstructing right renal calculus. No ureteral or bladder calculi are seen. No hydronephrosis. The adrenal glands are within normal limits. Bladder is within normal limits, displaced into the anterior abdomen. Stomach/Bowel: There is a large amount  of stool dilating the rectosigmoid colon with wall thickening and surrounding inflammatory stranding. There is no bowel obstruction, pneumatosis or free air. The appendix is not visualized. Small bowel and stomach are within normal limits. Vascular/Lymphatic: No significant vascular findings are present. No enlarged abdominal or pelvic lymph nodes. Reproductive: Prostate is unremarkable. Other: There small fat containing inguinal and umbilical hernias. 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 IMPRESSION: 1. Large amount of stool dilating the  rectosigmoid colon with wall thickening and surrounding inflammatory stranding compatible with stercoral colitis. 2. Nonobstructing right renal calculus. 3. 6.9 cm hypodense lesion in the right lobe of the liver, likely a cyst. This can be confirmed with ultrasound. 4. 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 Electronically Signed   By: Darliss Cheney M.D.   On: 05/20/2023 00:19   CT Head Wo Contrast Result Date: 05/20/2023 CLINICAL DATA:  Mental status change, unknown cause EXAM: CT HEAD WITHOUT CONTRAST TECHNIQUE: Contiguous axial images were obtained from the base of the skull through the vertex without intravenous contrast. RADIATION DOSE REDUCTION: This exam was performed according to the departmental dose-optimization program which includes automated exposure control, adjustment of the mA and/or kV according to patient size and/or use of iterative reconstruction technique. COMPARISON:  CT head August 16, 2017. FINDINGS: Brain: No evidence of acute large vascular territory infarction, hemorrhage, hydrocephalus, extra-axial collection or mass lesion/mass effect. Temporal lobe predominant cerebral atrophy. Vascular: No hyperdense vessel. Skull: No acute fracture. Sinuses/Orbits: Opacified right maxillary sinus with surrounding osteitis, suggestive of chronic sinusitis. No acute orbital finding Other: No mastoid effusions. IMPRESSION: 1. No evidence of acute intracranial abnormality. 2. Temporal lobe predominant cerebral atrophy, which can be seen in Alzheimer's type dementia. 3. Chronic right maxillary sinusitis. Electronically Signed   By: Feliberto Harts M.D.   On: 05/20/2023 00:16   DG Chest Port 1 View Result Date: 05/19/2023 CLINICAL DATA:  Sepsis. EXAM: PORTABLE CHEST 1 VIEW COMPARISON:  August 02, 2022.  August 16, 2017. FINDINGS: The heart size and mediastinal contours are within normal limits. Both lungs are clear. Stable elevated left hemidiaphragm. Stable  sclerotic density seen in the right scapula. IMPRESSION: No acute abnormality seen. Electronically Signed   By: Lupita Raider M.D.   On: 05/19/2023 18:29        Scheduled Meds:  heparin  5,000 Units Subcutaneous Q8H   levETIRAcetam  500 mg Oral BID   OXcarbazepine  150 mg Oral QHS   Oxcarbazepine  300 mg Oral Daily   Continuous Infusions:  sodium chloride Stopped (05/20/23 1324)   [START ON 05/21/2023] cefTRIAXone (ROCEPHIN)  IV     sodium chloride     [START ON 05/21/2023] vancomycin       LOS: 0 days    Time spent: 55 minutes    Berton Mount, MD  [facility] Pager #: [phone] 7PM-7AM contact night coverage as above

## 2023-05-20 NOTE — ED Provider Notes (Signed)
Patient signed out pending CT head, abdomen, urinalysis.  In brief, thought to be less responsive than baseline although at baseline does not have a very good cognitive status.  Severely dehydrated on exam and lab support this.  CT abdomen pelvis shows evidence of likely constipation.  Patient given enema.  Patient admitted to hospitalist.  Physical Exam  BP 131/77   Pulse (!) 38   Temp 99.5 F (37.5 C) (Axillary)   Resp 17   SpO2 100%   Physical Exam  Procedures  Procedures  ED Course / MDM   Clinical Course as of 05/20/23 0742  Thu May 19, 2023  2035 Patient has received IV fluids. I placed [place] IV for blood draw as phlebotomy and nursing could not draw blood  [WS]  Fri May 20, 2023  0008 Signed out to Dr. Wilkie Aye pending CT head, CT abdomen, UA [WS]    Clinical Course User Index [WS] Lonell Grandchild, MD   Medical Decision Making Amount and/or Complexity of Data Reviewed Labs: ordered. Radiology: ordered.  Risk OTC drugs. Prescription drug management. Decision regarding hospitalization.   Problem List Items Addressed This Visit   None Visit Diagnoses       Acute kidney injury [facility])    -  Primary     Dehydration         Stercoral colitis                 Shon Baton, MD 05/20/23 [phone]

## 2023-05-20 NOTE — ED Notes (Signed)
Notified MD about possible run of Vtach and clarification about DNR limited

## 2023-05-20 NOTE — Progress Notes (Signed)
Pharmacy Antibiotic Note  Christopher Hobbs is a 83 y.o. male admitted on 05/19/2023 from facility with AMS/hypotensive.  Pharmacy has been consulted for vancomycin dosing for sepsis.  -WBC 10.5, sCr 2.42, afebrile -Urine and blood cultures collected  Plan: -Ceftriaxone 1g IV every 24 hours per MD -Vancomycin 2g IV x1 -Vancomycin 750mg  IV every 24 hours (AUC 525, Vd 0.5, IBW) -Monitor renal function -Follow up signs of clinical improvement, LOT, de-escalation of antibiotics   Height: 6' (182.9 cm) Weight: 108.9 kg (240 lb) IBW/kg (Calculated) : 77.6  Temp (24hrs), Avg:98.7 F (37.1 C), Min:98 F (36.7 C), Max:99.5 F (37.5 C)  Recent Labs  Lab 05/19/23 2040 05/19/23 2045 05/20/23 0027 05/20/23 0441  WBC 9.7  --   --  10.5  CREATININE 3.06*  --   --  2.42*  LATICACIDVEN  --  2.9* 2.8*  --     Estimated Creatinine Clearance: 30.5 mL/min (A) (by C-G formula based on SCr of 2.42 mg/dL (H)).    No Known Allergies  Antimicrobials this admission: Ceftriaxone 1/31 >>  Vancomycin 1/31 >>   Microbiology results: 1/31 BCx:  1/31 UCx:    Thank you for allowing pharmacy to be a part of this patient's care.  Fayrene Fearing [facility] 05/20/2023 6:09 AM

## 2023-05-20 NOTE — Progress Notes (Signed)
 EEG complete - results pending

## 2023-05-20 NOTE — ED Notes (Signed)
Attempted AM labs but unsuccessful  [facility] lab notified for need of blood draw

## 2023-05-20 NOTE — H&P (Addendum)
History and Physical    Christopher Hobbs ZOX:[id number] DOB: 07/29/1940 DOA: 05/19/2023  PCP: Georgann Housekeeper, MD  Patient coming from: home  I have personally briefly reviewed patient's old medical records in [facility]  Chief Complaint: change in mental status   HPI: Christopher Hobbs is a 83 y.o. male with medical history significant of  Dementia , seizure, who  is BIB EMS with change in mental status as well as hypotension form facility. Per notes facility noted decline x 1 week. In the field patient bp was 80/50 and was given 500cc bolus by EMS with recheck noting sat 90/58. In the field patient glucose was also noted to be 348. Patient is unable to assist with history due to baseline dementia .   ED Course:  IN ED  Vitals:  Afeb, bp 86/74 repeat (118/102), hr 107,   Wbc 9.7, hgb 9.8, plt 152 Na 154, K 3.2, cl 121, gly 211, cr3.06 (1.12) Lactic 2.9 RVP: neg  Cxr: No acute abnormality seen  CTH: 1. No evidence of acute intracranial abnormality. 2. Temporal lobe predominant cerebral atrophy, which can be seen in Alzheimer's type dementia. 3. Chronic right maxillary sinusitis. EKG: sinus tachycardia  ventricular bigeminy   IMPRESSION: 1. Large amount of stool dilating the rectosigmoid colon with wall thickening and surrounding inflammatory stranding compatible with stercoral colitis. 2. Nonobstructing right renal calculus. 3. 6.9 cm hypodense lesion in the right lobe of the liver, likely a cyst. This can be confirmed with ultrasound. 4. 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  Ua: + BACTERIA , + RBC  Tx NS 3L   Review of Systems: As per HPI otherwise 10 point review of systems negative.   Past Medical History:  Diagnosis Date   Dementia (HCC)    Seizures (HCC)     No past surgical history on file.   reports that he has never smoked. He has never used smokeless tobacco. He reports that he does not currently use alcohol. He  reports that he does not currently use drugs.  No Known Allergies  No family history on file.  Prior to Admission medications   Medication Sig Start Date End Date Taking? Authorizing Provider  hydrochlorothiazide (HYDRODIURIL) 12.5 MG tablet Take 12.5 mg by mouth daily. 03/13/23  Yes [provider]  losartan (COZAAR) 25 MG tablet 25 mg daily. 03/13/23  Yes [provider]  mirtazapine (REMERON) 15 MG tablet Take 15 mg by mouth at bedtime. 05/04/23  Yes [provider]  OLANZapine (ZYPREXA) 2.5 MG tablet Take 2.5 mg by mouth daily. 07/11/22  Yes [provider]  Cholecalciferol (VITAMIN D3) 1.25 MG (50000 UT) TABS Take 1.25 mg by mouth once a week. Saturday    [provider]  fluticasone (FLONASE) 50 MCG/ACT nasal spray Place 1 spray into both nostrils 2 (two) times daily. 06/11/22   [provider]  furosemide (LASIX) 40 MG tablet Take 40 mg by mouth daily.    [provider]  levETIRAcetam (KEPPRA) 500 MG tablet Take 1 tablet (500 mg total) by mouth 2 (two) times daily. Patient taking differently: Take 500 mg by mouth at bedtime. 08/16/17 01/03/25  Joy, Shawn C, PA-C  lisinopril (ZESTRIL) 10 MG tablet Take 10 mg by mouth daily. 06/11/22   [provider]  Melatonin 3 MG TABS Take 6 mg by mouth at bedtime.    [provider]  OXcarbazepine (TRILEPTAL) 150 MG tablet Take 150 mg by mouth at  bedtime. 06/11/22   [provider]  Oxcarbazepine (TRILEPTAL) 300 MG tablet Take 300 mg by mouth daily. Stable mood    [provider]  vitamin B-12 (CYANOCOBALAMIN) 1000 MCG tablet Take 1,000 mcg by mouth daily.    [provider]    Physical Exam: Vitals:   05/19/23 2353 05/20/23 0015 05/20/23 0045 05/20/23 0130  BP:  108/70 (!) 108/57 (!) 106/91  Pulse:  (!) 57  (!) 117  Resp:  18 11 13   Temp: 99.5 F (37.5 C)     TempSrc: Axillary     SpO2:  100%  98%    Constitutional: NAD, calm,  comfortable Vitals:   05/19/23 2353 05/20/23 0015 05/20/23 0045 05/20/23 0130  BP:  108/70 (!) 108/57 (!) 106/91  Pulse:  (!) 57  (!) 117  Resp:  18 11 13   Temp: 99.5 F (37.5 C)     TempSrc: Axillary     SpO2:  100%  98%   Eyes: PERRL, lids and conjunctivae normal ENMT: Mucous membranes are dry poor dentition.  Neck: normal, supple, no masses, no thyromegaly Respiratory: clear to auscultation anteriorly, no wheezing, no crackles. Normal respiratory effort. No accessory muscle use.  Cardiovascular: Regular rate and rhythm, no murmurs / rubs / gallops. No extremity edema.warm extremities .  Abdomen: no tenderness, no masses palpated. No hepatosplenomegaly. Bowel sounds positive.  Musculoskeletal: no clubbing / cyanosis. No joint deformity upper and lower extremities.no contractures. Normal muscle tone.  Skin: ulcer right ankle,  Neurologic: unable to assess moving extremities  Psychiatric:unable to assess  Labs on Admission: I have personally reviewed following labs and imaging studies  CBC: Recent Labs  Lab 05/19/23 2040  WBC 9.7  NEUTROABS 6.6  HGB 9.8*  HCT 33.4*  MCV 95.4  PLT 152   Basic Metabolic Panel: Recent Labs  Lab 05/19/23 2040  NA 154*  K 3.2*  CL 121*  CO2 24  GLUCOSE 211*  BUN 64*  CREATININE 3.06*  CALCIUM 8.2*   GFR: CrCl cannot be calculated (Unknown ideal weight.). Liver Function Tests: Recent Labs  Lab 05/19/23 2040  AST 25  ALT 18  ALKPHOS 316*  BILITOT 0.6  PROT 5.8*  ALBUMIN 2.2*   No results for input(s): "LIPASE", "AMYLASE" in the last 168 hours. No results for input(s): "AMMONIA" in the last 168 hours. Coagulation Profile: Recent Labs  Lab 05/19/23 2040  INR 1.4*   Cardiac Enzymes: No results for input(s): "CKTOTAL", "CKMB", "CKMBINDEX", "TROPONINI" in the last 168 hours. BNP (last 3 results) No results for input(s): "PROBNP" in the last 8760 hours. HbA1C: No results for input(s): "HGBA1C" in the last 72  hours. CBG: No results for input(s): "GLUCAP" in the last 168 hours. Lipid Profile: No results for input(s): "CHOL", "HDL", "LDLCALC", "TRIG", "CHOLHDL", "LDLDIRECT" in the last 72 hours. Thyroid Function Tests: No results for input(s): "TSH", "T4TOTAL", "FREET4", "T3FREE", "THYROIDAB" in the last 72 hours. Anemia Panel: No results for input(s): "VITAMINB12", "FOLATE", "FERRITIN", "TIBC", "IRON", "RETICCTPCT" in the last 72 hours. Urine analysis:    Component Value Date/Time   COLORURINE AMBER (A) 05/20/2023 0050   APPEARANCEUR HAZY (A) 05/20/2023 0050   LABSPEC 1.018 05/20/2023 0050   PHURINE 5.0 05/20/2023 0050   GLUCOSEU NEGATIVE 05/20/2023 0050   HGBUR MODERATE (A) 05/20/2023 0050   BILIRUBINUR NEGATIVE 05/20/2023 0050   KETONESUR NEGATIVE 05/20/2023 0050   PROTEINUR 30 (A) 05/20/2023 0050   NITRITE NEGATIVE 05/20/2023 0050   LEUKOCYTESUR NEGATIVE 05/20/2023 0050    Radiological  Exams on Admission: CT ABDOMEN PELVIS WO CONTRAST Result Date: 05/20/2023 CLINICAL DATA:  Abdominal pain. EXAM: CT ABDOMEN AND PELVIS WITHOUT CONTRAST TECHNIQUE: Multidetector CT imaging of the abdomen and pelvis was performed following the standard protocol without IV contrast. RADIATION DOSE REDUCTION: This exam was performed according to the departmental dose-optimization program which includes automated exposure control, adjustment of the mA and/or kV according to patient size and/or use of iterative reconstruction technique. COMPARISON:  None Available. FINDINGS: Lower chest: There is atelectasis in the lung bases. Hepatobiliary: There is a 6.9 by 6.4 cm hypodense lesion in the right lobe of the liver measuring 9 Hounsfield units. The gallbladder and bile ducts are within normal limits. Pancreas: Unremarkable. No pancreatic ductal dilatation or surrounding inflammatory changes. Spleen: Normal in size without focal abnormality. Adrenals/Urinary Tract: There is a punctate nonobstructing right renal calculus.  No ureteral or bladder calculi are seen. No hydronephrosis. The adrenal glands are within normal limits. Bladder is within normal limits, displaced into the anterior abdomen. Stomach/Bowel: There is a large amount of stool dilating the rectosigmoid colon with wall thickening and surrounding inflammatory stranding. There is no bowel obstruction, pneumatosis or free air. The appendix is not visualized. Small bowel and stomach are within normal limits. Vascular/Lymphatic: No significant vascular findings are present. No enlarged abdominal or pelvic lymph nodes. Reproductive: Prostate is unremarkable. Other: There small fat containing inguinal and umbilical hernias. 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 IMPRESSION: 1. Large amount of stool dilating the rectosigmoid colon with wall thickening and surrounding inflammatory stranding compatible with stercoral colitis. 2. Nonobstructing right renal calculus. 3. 6.9 cm hypodense lesion in the right lobe of the liver, likely a cyst. This can be confirmed with ultrasound. 4. 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 Electronically Signed   By: Darliss Cheney M.D.   On: 05/20/2023 00:19   CT Head Wo Contrast Result Date: 05/20/2023 CLINICAL DATA:  Mental status change, unknown cause EXAM: CT HEAD WITHOUT CONTRAST TECHNIQUE: Contiguous axial images were obtained from the base of the skull through the vertex without intravenous contrast. RADIATION DOSE REDUCTION: This exam was performed according to the departmental dose-optimization program which includes automated exposure control, adjustment of the mA and/or kV according to patient size and/or use of iterative reconstruction technique. COMPARISON:  CT head August 16, 2017. FINDINGS: Brain: No evidence of acute large vascular territory infarction, hemorrhage, hydrocephalus, extra-axial collection or mass lesion/mass  effect. Temporal lobe predominant cerebral atrophy. Vascular: No hyperdense vessel. Skull: No acute fracture. Sinuses/Orbits: Opacified right maxillary sinus with surrounding osteitis, suggestive of chronic sinusitis. No acute orbital finding Other: No mastoid effusions. IMPRESSION: 1. No evidence of acute intracranial abnormality. 2. Temporal lobe predominant cerebral atrophy, which can be seen in Alzheimer's type dementia. 3. Chronic right maxillary sinusitis. Electronically Signed   By: Feliberto Harts M.D.   On: 05/20/2023 00:16   DG Chest Port 1 View Result Date: 05/19/2023 CLINICAL DATA:  Sepsis. EXAM: PORTABLE CHEST 1 VIEW COMPARISON:  August 02, 2022.  August 16, 2017. FINDINGS: The heart size and mediastinal contours are within normal limits. Both lungs are clear. Stable elevated left hemidiaphragm. Stable sclerotic density seen in the right scapula. IMPRESSION: No acute abnormality seen. Electronically Signed   By: Lupita Raider M.D.   On: 05/19/2023 18:29    EKG: Independently reviewed.   Assessment/Plan Severe dehydration leading to AKI -admit to cardiac tele  - s/p 3L  ED with stabilization of BP -continue with ivfs  -strict I/o /place foley cath  -check CK   Sepsis -hypotension/ elevated lactic / tachycardia  -continue on ivfs   Hypernatremia  -due to poor po intake insetting of dementia  -trend NA labs   Hypokalemia -replete prn   Acute metabolic encephalopathy  -presumed due to acute illness in  background of dementia  -to be complete will r/o f/u with MRI as CTH negative , also EEG   Hx of Seizure d/o  -f/u with EEG -no mention of seizure per facility  -resume keppra/trileptal   Dementia -resume home regimen as able   HTN - hold arb/ htctz  -prn hydralazine  DVT prophylaxis: heparin Code Status:DNR  document from NH Family Communication: none at bedside Disposition Plan: patient  expected to be admitted greater than 2 midnights  Consults called:  n/a Admission status: progressive    Lurline Del MD [facility]   If 7PM-7AM, please contact night-coverage [email or web address] Password [facility]  05/20/2023, 2:49 AM

## 2023-05-20 NOTE — ED Notes (Signed)
Patient had incontinent diarrhea; peri care and pad changed. Flexi seal placed per order

## 2023-05-20 NOTE — ED Notes (Signed)
Informed RN Shannen pt BP was 76/54(51)

## 2023-05-20 NOTE — ED Notes (Signed)
Incontinent BM - brown diarrhea; pericare and linen change

## 2023-05-20 NOTE — ED Provider Notes (Signed)
[place] EMERGENCY DEPARTMENT AT [facility] Provider Note  CSN: [id number] Arrival date & time: 05/19/23 1722  Chief Complaint(s) Hypotension  HPI Christopher Hobbs is a 83 y.o. male with history of advanced dementia, reported seizure disorder presenting to the emergency department with decreased responsiveness.  Patient nonverbal at baseline.  Apparently there was some reported decline at facility in the past week.  Patient nonambulatory at baseline.  History limited due to dementia.   Past Medical History Past Medical History:  Diagnosis Date   Dementia (HCC)    Seizures (HCC)    There are no active problems to display for this patient.  Home Medication(s) Prior to Admission medications   Medication Sig Start Date End Date Taking? Authorizing Provider  Cholecalciferol (VITAMIN D3) 1.25 MG (50000 UT) TABS Take 1.25 mg by mouth once a week. Saturday    [provider]  fluticasone (FLONASE) 50 MCG/ACT nasal spray Place 1 spray into both nostrils 2 (two) times daily. 06/11/22   [provider]  furosemide (LASIX) 40 MG tablet Take 40 mg by mouth daily.    [provider]  levETIRAcetam (KEPPRA) 500 MG tablet Take 1 tablet (500 mg total) by mouth 2 (two) times daily. Patient taking differently: Take 500 mg by mouth at bedtime. 08/16/17 01/03/25  Joy, Shawn C, PA-C  lisinopril (ZESTRIL) 10 MG tablet Take 10 mg by mouth daily. 06/11/22   [provider]  Melatonin 3 MG TABS Take 6 mg by mouth at bedtime.    [provider]  OXcarbazepine (TRILEPTAL) 150 MG tablet Take 150 mg by mouth at bedtime. 06/11/22   [provider]  Oxcarbazepine (TRILEPTAL) 300 MG tablet Take 300 mg by mouth daily. Stable mood    [provider]  vitamin B-12 (CYANOCOBALAMIN) 1000 MCG tablet Take 1,000 mcg by mouth daily.    [provider]                                                                                                                                     Past Surgical History No past surgical history on file. Family History No family history on file.  Social History Social History   Tobacco Use   Smoking status: Never   Smokeless tobacco: Never  Vaping Use   Vaping status: Never Used  Substance Use Topics   Alcohol use: Not Currently   Drug use: Not Currently   Allergies Patient has no known allergies.  Review of Systems Review of Systems  Unable to perform ROS: Dementia    Physical Exam Vital Signs  I have reviewed the triage vital signs BP 131/77   Pulse (!) 38   Temp 99.5 F (37.5 C) (Axillary)   Resp 17   SpO2 100%  Physical Exam Vitals and nursing note reviewed.  Constitutional:      General: He is not in acute distress.    Comments: Tracks  around the room, no acute distress  HENT:     Mouth/Throat:     Mouth: Mucous membranes are dry.  Eyes:     Conjunctiva/sclera: Conjunctivae normal.  Cardiovascular:     Rate and Rhythm: Normal rate and regular rhythm.  Pulmonary:     Effort: Pulmonary effort is normal. No respiratory distress.     Breath sounds: Normal breath sounds.  Abdominal:     General: Abdomen is flat.     Palpations: Abdomen is soft.     Tenderness: There is abdominal tenderness (grimaces on palpation of lower abdomen).  Musculoskeletal:     Cervical back: Neck supple.     Right lower leg: No edema.     Left lower leg: No edema.  Skin:    General: Skin is warm and dry.     Capillary Refill: Capillary refill takes less than 2 seconds.  Neurological:     Comments: Moves all 4 extremities spontaneously, does not follow commands, no spontaneous speech, tracks around the room     ED Results and Treatments Labs (all labs ordered are listed, but only abnormal results are displayed) Labs Reviewed  COMPREHENSIVE METABOLIC PANEL - Abnormal; Notable for the following components:      Result Value   Sodium 154 (*)    Potassium 3.2 (*)    Chloride 121 (*)     Glucose, Bld 211 (*)    BUN 64 (*)    Creatinine, Ser 3.06 (*)    Calcium 8.2 (*)    Total Protein 5.8 (*)    Albumin 2.2 (*)    Alkaline Phosphatase 316 (*)    GFR, Estimated 20 (*)    All other components within normal limits  CBC WITH DIFFERENTIAL/PLATELET - Abnormal; Notable for the following components:   RBC 3.50 (*)    Hemoglobin 9.8 (*)    HCT 33.4 (*)    MCHC 29.3 (*)    Monocytes Absolute 1.2 (*)    All other components within normal limits  PROTIME-INR - Abnormal; Notable for the following components:   Prothrombin Time 17.7 (*)    INR 1.4 (*)    All other components within normal limits  I-STAT CG4 LACTIC ACID, ED - Abnormal; Notable for the following components:   Lactic Acid, Venous 2.9 (*)    All other components within normal limits  RESP PANEL BY RT-PCR (RSV, FLU A&B, COVID)  RVPGX2  CULTURE, BLOOD (ROUTINE X 2)  CULTURE, BLOOD (ROUTINE X 2)  APTT  URINALYSIS, W/ REFLEX TO CULTURE (INFECTION SUSPECTED)  I-STAT CG4 LACTIC ACID, ED                                                                                                                          Radiology DG Chest Port 1 View Result Date: 05/19/2023 CLINICAL DATA:  Sepsis. EXAM: PORTABLE CHEST 1 VIEW COMPARISON:  August 02, 2022.  August 16, 2017. FINDINGS: The heart size and mediastinal contours are  within normal limits. Both lungs are clear. Stable elevated left hemidiaphragm. Stable sclerotic density seen in the right scapula. IMPRESSION: No acute abnormality seen. Electronically Signed   By: Lupita Raider M.D.   On: 05/19/2023 18:29    Pertinent labs & imaging results that were available during my care of the patient were reviewed by me and considered in my medical decision making (see MDM for details).  Medications Ordered in ED Medications  potassium chloride 10 mEq in 100 mL IVPB (has no administration in time range)  sodium chloride 0.9 % bolus 3,000 mL (0 mLs Intravenous Stopped 05/19/23 2351)                                                                                                                                      Procedures .Ultrasound ED Peripheral IV (Provider)  Date/Time: 05/20/2023 12:17 AM  Performed by: Lonell Grandchild, MD Authorized by: Lonell Grandchild, MD   Procedure details:    Indications: hydration, multiple failed IV attempts and poor IV access     Skin Prep: chlorhexidine gluconate     Location:  Right AC   Angiocath:  18 G   Bedside Ultrasound Guided: Yes     Images: not archived     Patient tolerated procedure without complications: Yes     Dressing applied: Yes   .Critical Care  Performed by: Lonell Grandchild, MD Authorized by: Lonell Grandchild, MD   Critical care provider statement:    Critical care time (minutes):  30   Critical care was necessary to treat or prevent imminent or life-threatening deterioration of the following conditions:  Dehydration   Critical care was time spent personally by me on the following activities:  Development of treatment plan with patient or surrogate, discussions with consultants, evaluation of patient's response to treatment, examination of patient, ordering and review of laboratory studies, ordering and review of radiographic studies, ordering and performing treatments and interventions, pulse oximetry, re-evaluation of patient's condition and review of old charts   (including critical care time)  Medical Decision Making / ED Course   MDM:  83 year old presenting to the emergency department with low blood pressure, reported altered mental status.  On exam, patient appears severely dehydrated.  Exam otherwise notable for lower abdominal tenderness.  History is essentially not obtainable due to severe dementia.  Labs concerning for severe dehydration with AKI, hypernatremia.  Patient received fluids with improvement after initial hypotension.  He does seem more alert and looking around the room  more.  No fever, rectal temperature was obtained which was normal.  No leukocytosis of not covered empirically with antibiotics.  He does have an elevated lactate which is likely due to dehydration.  He did seem to have some lower abdominal tenderness so will obtain CT scan to evaluate for any intra-abdominal pathology or infection such as diverticulitis, perforation, abscess, volvulus, obstruction.  Will also  obtain CT head given reported decreased alertness.  Will also need urinalysis.  Signed out to oncoming provider pending CT scans and reassessment.  Clinical Course as of 05/20/23 0018  Thu May 19, 2023  2035 Patient has received IV fluids. I placed [place] IV for blood draw as phlebotomy and nursing could not draw blood  [WS]  Fri May 20, 2023  0008 Signed out to Dr. Wilkie Aye pending CT head, CT abdomen, UA [WS]    Clinical Course User Index [WS] Lonell Grandchild, MD     Additional history obtained: -Additional history obtained from ems -External records from outside source obtained and reviewed including: Chart review including previous notes, labs, imaging, consultation notes including prior notes    Lab Tests: -I ordered, reviewed, and interpreted labs.   The pertinent results include:   Labs Reviewed  COMPREHENSIVE METABOLIC PANEL - Abnormal; Notable for the following components:      Result Value   Sodium 154 (*)    Potassium 3.2 (*)    Chloride 121 (*)    Glucose, Bld 211 (*)    BUN 64 (*)    Creatinine, Ser 3.06 (*)    Calcium 8.2 (*)    Total Protein 5.8 (*)    Albumin 2.2 (*)    Alkaline Phosphatase 316 (*)    GFR, Estimated 20 (*)    All other components within normal limits  CBC WITH DIFFERENTIAL/PLATELET - Abnormal; Notable for the following components:   RBC 3.50 (*)    Hemoglobin 9.8 (*)    HCT 33.4 (*)    MCHC 29.3 (*)    Monocytes Absolute 1.2 (*)    All other components within normal limits  PROTIME-INR - Abnormal; Notable for the following components:    Prothrombin Time 17.7 (*)    INR 1.4 (*)    All other components within normal limits  I-STAT CG4 LACTIC ACID, ED - Abnormal; Notable for the following components:   Lactic Acid, Venous 2.9 (*)    All other components within normal limits  RESP PANEL BY RT-PCR (RSV, FLU A&B, COVID)  RVPGX2  CULTURE, BLOOD (ROUTINE X 2)  CULTURE, BLOOD (ROUTINE X 2)  APTT  URINALYSIS, W/ REFLEX TO CULTURE (INFECTION SUSPECTED)  I-STAT CG4 LACTIC ACID, ED    Notable for hypernatremia, AKI  EKG   EKG Interpretation Date/Time:  Thursday May 19 2023 22:44:46 EST Ventricular Rate:  109 PR Interval:  225 QRS Duration:  109 QT Interval:  349 QTC Calculation: 430 R Axis:   70  Text Interpretation: Sinus tachycardia Ventricular bigeminy Prolonged PR interval Borderline T abnormalities, inferior leads Confirmed by Alvino Blood ([id number]) on 05/19/2023 10:52:22 PM         Imaging Studies ordered: I ordered imaging studies including CXR, CT abd, CT head  On my interpretation imaging demonstrates no pneumonia  I independently visualized and interpreted imaging. I agree with the radiologist interpretation   Medicines ordered and prescription drug management: Meds ordered this encounter  Medications   sodium chloride 0.9 % bolus 3,000 mL   potassium chloride 10 mEq in 100 mL IVPB    -I have reviewed the patients home medicines and have made adjustments as needed   Cardiac Monitoring: The patient was maintained on a cardiac monitor.  I personally viewed and interpreted the cardiac monitored which showed an underlying rhythm of: NSR   Reevaluation: After the interventions noted above, I reevaluated the patient and found that their symptoms have improved  Co morbidities  that complicate the patient evaluation  Past Medical History:  Diagnosis Date   Dementia (HCC)    Seizures (HCC)       Dispostion: Disposition decision including need for hospitalization was considered, and  patient disposition pending at time of sign out.    Final Clinical Impression(s) / ED Diagnoses Final diagnoses:  Acute kidney injury [facility])     This chart was dictated using voice recognition software.  Despite best efforts to proofread,  errors can occur which can change the documentation meaning.    Lonell Grandchild, MD 05/20/23 [phone]

## 2023-05-20 NOTE — ED Notes (Signed)
Spoke to hospitalist concerning:  Unable to pass swallow screen for oral meds -- states will change keppra to IV dose diarrhea x 2 incidences - VO to place rectal tube

## 2023-05-20 NOTE — ED Notes (Signed)
Pt unable to follow commands or answer questions. Eyes do not track nurse when being spoken to. Unable to give daily medications. MD was notified about different route for medications

## 2023-05-20 NOTE — Progress Notes (Signed)
Patient's RN reported that patient having trouble swallowing and did not pass swallow screen.  Patient also having ongoing diarrhea having persistent liquid stool.  Patient is also hypotensive and tachycardic. -Switch to oral Keppra to IV Keppra. -Requested speech for swallow evaluation. - Keeping patient n.p.o. -Given patient has elevated lactic acid concern for sepsis in the setting of ongoing diarrhea giving another liter of 0.45 percent NS bolus and continue half-normal NS as patient is also hyponatremic. - Checking GI and C. difficile panel.  Ordered rectal fecal tube.   Tereasa Coop, MD [facility] 05/20/2023, 9:45 PM    -RN reported that patient has persistent tachycardia.  EKG showing sinus tachycardia heart rate 133 with premature ventricular complex.  Given patient's blood pressure has been improved with IV fluid and will resume blood pressure is 130/90 giving patient Lopressor 2.5 mg x 2 doses for heart rate level 120.  Will continue to monitor heart rate and blood pressure very closely. Lactic acid level has been improved to 2.1-1.8 after IV fluid bolus.  Renal function showing sodium has not improved much still 154 range, low potassium 3.1.  Low bicarb 21.  Continue half-normal saline 125 cc/h.  Replating IV KCl total 40 mEq.   Tereasa Coop, MD [facility] 05/21/2023, 12:40 AM

## 2023-05-20 NOTE — Procedures (Signed)
Patient Name: Christopher Hobbs  MRN: [id number]  Epilepsy Attending: Charlsie Quest  Referring Physician/Provider: Lurline Del, MD  Date: 05/20/2023 Duration: 23.47 mins  Patient history: 83yo M with ams getting eeg to evaluate for seizure  Level of alertness: Awake  AEDs during EEG study: LEV, OXC  Technical aspects: This EEG study was done with scalp electrodes positioned according to the 10-20 International system of electrode placement. Electrical activity was reviewed with band pass filter of 1-70Hz , sensitivity of 7 uV/mm, display speed of 59mm/sec with a 60Hz  notched filter applied as appropriate. EEG data were recorded continuously and digitally stored.  Video monitoring was available and reviewed as appropriate.  Description: EEG showed continuous generalized 3 to 6 Hz theta-delta slowing. Hyperventilation and photic stimulation were not performed.     ABNORMALITY - Continuous slow, generalized  IMPRESSION: This study is suggestive of moderate diffuse encephalopathy. No seizures or epileptiform discharges were seen throughout the recording.  Naylea Wigington Annabelle Harman

## 2023-05-20 NOTE — ED Notes (Signed)
Patient had large amount of diarrhea. Peri care and foley care complete. Linen change completed

## 2023-05-21 ENCOUNTER — Inpatient Hospital Stay (HOSPITAL_COMMUNITY): Payer: Medicare Other

## 2023-05-21 DIAGNOSIS — N179 Acute kidney failure, unspecified: Secondary | ICD-10-CM | POA: Diagnosis not present

## 2023-05-21 LAB — RENAL FUNCTION PANEL
Albumin: 2.1 g/dL — ABNORMAL LOW (ref 3.5–5.0)
Anion gap: 11 (ref 5–15)
BUN: 39 mg/dL — ABNORMAL HIGH (ref 8–23)
CO2: 18 mmol/L — ABNORMAL LOW (ref 22–32)
Calcium: 9 mg/dL (ref 8.9–10.3)
Chloride: 121 mmol/L — ABNORMAL HIGH (ref 98–111)
Creatinine, Ser: 1.46 mg/dL — ABNORMAL HIGH (ref 0.61–1.24)
GFR, Estimated: 48 mL/min — ABNORMAL LOW (ref >60)
Glucose, Bld: 156 mg/dL — ABNORMAL HIGH (ref 70–99)
Phosphorus: 2.3 mg/dL — ABNORMAL LOW (ref 2.5–4.6)
Potassium: 4.3 mmol/L (ref 3.5–5.1)
Sodium: 150 mmol/L — ABNORMAL HIGH (ref 135–145)

## 2023-05-21 LAB — CBC
HCT: 33.1 % — ABNORMAL LOW (ref 39.0–52.0)
Hemoglobin: 9.7 g/dL — ABNORMAL LOW (ref 13.0–17.0)
MCH: 28.7 pg (ref 26.0–34.0)
MCHC: 29.3 g/dL — ABNORMAL LOW (ref 30.0–36.0)
MCV: 97.9 fL (ref 80.0–100.0)
Platelets: 160 10*3/uL (ref 150–400)
RBC: 3.38 MIL/uL — ABNORMAL LOW (ref 4.22–5.81)
RDW: 15.3 % (ref 11.5–15.5)
WBC: 12.2 10*3/uL — ABNORMAL HIGH (ref 4.0–10.5)
nRBC: 0 % (ref 0.0–0.2)

## 2023-05-21 LAB — GLUCOSE, CAPILLARY
Glucose-Capillary: 116 mg/dL — ABNORMAL HIGH (ref 70–99)
Glucose-Capillary: 118 mg/dL — ABNORMAL HIGH (ref 70–99)

## 2023-05-21 LAB — CBG MONITORING, ED
Glucose-Capillary: 140 mg/dL — ABNORMAL HIGH (ref 70–99)
Glucose-Capillary: 88 mg/dL (ref 70–99)
Glucose-Capillary: 105 mg/dL — ABNORMAL HIGH (ref 70–99)

## 2023-05-21 LAB — C DIFFICILE QUICK SCREEN W PCR REFLEX
C Diff antigen: NEGATIVE
C Diff interpretation: NOT DETECTED
C Diff toxin: NEGATIVE

## 2023-05-21 LAB — LACTIC ACID, PLASMA: Lactic Acid, Venous: 1.5 mmol/L (ref 0.5–1.9)

## 2023-05-21 LAB — MAGNESIUM: Magnesium: 1.7 mg/dL (ref 1.7–2.4)

## 2023-05-21 MED ORDER — POTASSIUM CHLORIDE 10 MEQ/100ML IV SOLN
10.0000 meq | INTRAVENOUS | Status: AC
Start: 2023-05-21 — End: 2023-05-21
  Administered 2023-05-21 (×4): 10 meq via INTRAVENOUS
  Filled 2023-05-21 (×4): qty 100

## 2023-05-21 MED ORDER — CHLORHEXIDINE GLUCONATE CLOTH 2 % EX PADS
6.0000 | MEDICATED_PAD | Freq: Every day | CUTANEOUS | Status: DC
  Administered 2023-05-21 – 2023-05-26 (×6): 6 via TOPICAL

## 2023-05-21 MED ORDER — DEXTROSE 50 % IV SOLN: 1.0000 | INTRAVENOUS | Status: DC | PRN

## 2023-05-21 MED ORDER — METOPROLOL TARTRATE 5 MG/5ML IV SOLN
2.5000 mg | INTRAVENOUS | Status: AC | PRN
Start: 2023-05-21 — End: 2023-05-21
  Administered 2023-05-21 (×2): 2.5 mg via INTRAVENOUS
  Filled 2023-05-21 (×2): qty 5

## 2023-05-21 MED ORDER — SODIUM CHLORIDE 0.45 % IV SOLN: INTRAVENOUS | Status: AC

## 2023-05-21 NOTE — ED Notes (Addendum)
Hospitalist page regarding presistent tachycardia on CCM - 130s. EKG completed

## 2023-05-21 NOTE — Evaluation (Signed)
Clinical/Bedside Swallow Evaluation Patient Details  Name: Christopher Hobbs MRN: [id number] Date of Birth: 1940-04-21  Today's Date: 05/21/2023 Time: SLP Start Time (ACUTE ONLY): 4742 SLP Stop Time (ACUTE ONLY): 0918 SLP Time Calculation (min) (ACUTE ONLY): 20 min  Past Medical History:  Past Medical History:  Diagnosis Date   Dementia (HCC)    Seizures (HCC)    Past Surgical History: History reviewed. No pertinent surgical history. HPI:  Pt is a 83 y.o. male who presented to ED with change in mental status as well as hypotension from facility. Per notes facility noted decline x 1 week. CT head (05/19/23) revealed "No evidence of acute intracranial abnormality. 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 EEG (05/20/23) "suggestive of moderate diffuse encephalopathy. No seizures or epileptiform discharges were seen...". Failed [facility]ale swallow screen 05/20/23. PMH:  Dementia , seizure.    Assessment / Plan / Recommendation  Clinical Impression  Pt presents with likely cognitive based dysphagia marked by reduced awareness of PO presentations, prolonged mastication and AP transit of solids intermittently. Pt with reduced ability to follow commands and required heavy verbal, tactile and visual cues to "open mouth", "take a bite", "sip from the straw" throughout PO trials. Once a few sips/bites were accepted, pt with improved automaticity with oral acceptance and oral manipulation of solids/liquids. Consecutive swallows of thin liquids by straw, bites of puree and small amount of graham cracker (regular solid) consumed without s/sx of aspiration. Mastication of regular solid was prolonged, with oral holding noted intermittently, which raises concerns for safety with this advanced texture during mealtime. Liquid wash provided to assist with oral clearance. All puree POs cleared from oral cavity without cueing or liquid wash. Recommend initiate dys 1 diet, thin liquids  with 1:1 supervision for all meals. Ensure pt is alert and sitting upright. Provide small bites/sips when assisting with feeding and provide cues as needed to improve awareness to oral presentations as noted above. SLP to f/u for tolerance and to further assess effective mealtime strategies to maximize safety with PO intake.  SLP Visit Diagnosis: Dysphagia, unspecified (R13.10)    Aspiration Risk  Mild aspiration risk    Diet Recommendation Dysphagia 1 (Puree);Thin liquid    Liquid Administration via: Straw Medication Administration: Crushed with puree Supervision: Full supervision/cueing for compensatory strategies Compensations: Minimize environmental distractions;Slow rate;Small sips/bites;Follow solids with liquid Postural Changes: Seated upright at 90 degrees    Other  Recommendations Oral Care Recommendations: Oral care BID    Recommendations for follow up therapy are one component of a multi-disciplinary discharge planning process, led by the attending physician.  Recommendations may be updated based on patient status, additional functional criteria and insurance authorization.  Follow up Recommendations Follow physician's recommendations for discharge plan and follow up therapies      Assistance Recommended at Discharge    Functional Status Assessment Patient has had a recent decline in their functional status and demonstrates the ability to make significant improvements in function in a reasonable and predictable amount of time.  Frequency and Duration min 2x/week  2 weeks       Prognosis Prognosis for improved oropharyngeal function: Fair Barriers to Reach Goals: Cognitive deficits;Time post onset      Swallow Study   General Date of Onset: 05/19/23 HPI: Pt is a 83 y.o. male who presented to ED with change in mental status as well as hypotension from facility. Per notes facility noted decline x 1 week. CT head (05/19/23) revealed "No evidence  of acute intracranial  abnormality. 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 EEG (05/20/23) "suggestive of moderate diffuse encephalopathy. No seizures or epileptiform discharges were seen...". Failed yale swallow screen 05/20/23. PMH:  Dementia , seizure. Type of Study: Bedside Swallow Evaluation Previous Swallow Assessment: none per EMR Diet Prior to this Study: NPO Temperature Spikes Noted: Yes (101.6) Respiratory Status: Room air History of Recent Intubation: No Behavior/Cognition: Alert;Confused;Requires cueing;Doesn't follow directions Oral Cavity Assessment: Dried secretions Oral Cavity - Dentition: Missing dentition;Poor condition Vision:  (difficult to assess) Self-Feeding Abilities: Total assist Patient Positioning: Upright in bed Baseline Vocal Quality: Normal Volitional Swallow:  (unable to assess)    Oral/Motor/Sensory Function Overall Oral Motor/Sensory Function:  (difficult to assess)   Ice Chips Ice chips: Not tested   Thin Liquid Thin Liquid: Within functional limits Presentation: Straw    Nectar Thick Nectar Thick Liquid: Not tested   Honey Thick Honey Thick Liquid: Not tested   Puree Puree: Within functional limits Presentation: Spoon   Solid     Solid: Impaired Oral Phase Impairments: Impaired mastication;Reduced lingual movement/coordination Oral Phase Functional Implications: Impaired mastication;Prolonged oral transit       Christopher Echevaria, MA, CCC-SLP Acute Rehabilitation Services Office Number: [phone]  [name] 05/21/2023,9:36 AM

## 2023-05-21 NOTE — ED Notes (Signed)
Pt has consistent pvc runs. Hr between 110-120.  MD made aware stated will look into it.

## 2023-05-21 NOTE — Progress Notes (Addendum)
PROGRESS NOTE    Jessi Jessop  WGN:[id number] DOB: 1940/10/17 DOA: 05/19/2023 PCP: Georgann Housekeeper, MD  Outpatient Specialists:     Brief Narrative:  Patient is an 83 year old male past medical history significant for dementia and seizure.  Patient was admitted with altered mental status, hypotension, acute kidney injury, dehydration with sodium of 155 (Free water deficit of at least 5.83 L) and possible UTI (UA revealed many bacteria, moderate hematuria but negative leukocyte).  Urine and blood cultures are pending.  Patient is currently on IV vancomycin and Rocephin.    05/20/2023: Patient seen alongside patient's niece and niece's daughter.  Patient is minimally responsive.  Patient's niece confirms that this is patient's baseline.  No significant history from patient.  Patient is a skilled nursing facility resident.  Intermittent hypotension is noted.  05/21/2023: Patient seen alongside patient's 2 nieces, one of the nieces daughter, and 3 nephews.  Family was updated.  Hypernatremia is improving.  Sodium is improved from 155-150.  Free water is not possible.  Will continue IV fluids for now.  Continue to monitor renal function and electrolytes closely.  Acute kidney injury is resolving.  Dehydration is improving.  Urine culture is growing E. coli.  Will continue IV Rocephin for now.  Will follow final cultures.   Assessment & Plan:   Principal Problem:   AKI (acute kidney injury) (HCC)  Acute kidney injury: -Likely secondary to volume depletion. -Cannot rule out ATN. -Baseline serum creatinine of 1.12. -Patient was admitted with serum creatinine of 3.06. -Serum creatinine has improved to 1.46 today.   -Continue IVF half-normal saline at 125 cc/h.  -Continue to monitor renal function and electrolytes.    Dehydration/hypernatremia: -Sodium has improved from 155-150.   -Elevated urine specific gravity. -Continue half-normal saline at 125 cc/h. -Free water is not feasible for now.   -Continue to monitor renal function and electrolytes. -Adjust hydration accordingly.  Possible UTI: -Urine cultures growing E. coli.   -Continue IV Rocephin.  Altered mental status: -Family reports acute altered mental status (within the last 1 to 2 weeks) -Patient significantly dehydrated. -Urine cultures growing E. coli.. -Manage dehydration cautiously. -Follow renal panel and, especially, sodium level. -Follow urine and blood cultures. -Continue IV Rocephin and vancomycin.  Dementia: -Suspect very advanced.  History of seizure: -No documented seizure.  Hypotension: -Likely multifactorial. -Patient is dehydrated. -Possible UTI/SIRS -Continue antibiotics. -Continue volume repletion. 05/21/2023: Improving.  Constipation/fecal impaction: -Laxative. -Trial of enema. 05/21/2023: Abdominal x-ray   DVT prophylaxis: Subcutaneous heparin. Code Status: DO NOT RESUSCITATE Family Communication: Niece and niece's daughter Disposition Plan: Likely back to skilled nursing facility.  Patient remains inpatient.  Serious and severe comorbidities.   Consultants:  Palliative care team.    Procedures:  None.  Antimicrobials:  Rocephin. IV vancomycin.   Subjective: Patient is not able to give significant history.  Objective: Vitals:   05/21/23 1321 05/21/23 1600 05/21/23 1944 05/21/23 1951  BP: 127/77 129/83 (!) 182/122 (!) 112/94  Pulse: 77 81 90 94  Resp: 19 17 16 17   Temp: 98 F (36.7 C) 98.1 F (36.7 C) 97.6 F (36.4 C) 99.1 F (37.3 C)  TempSrc: Axillary Axillary Oral Axillary  SpO2: 100% 100% 100% 100%  Weight:      Height:        Intake/Output Summary (Last 24 hours) at 05/21/2023 2001 Last data filed at 05/21/2023 1821 Gross per 24 hour  Intake 2401.37 ml  Output 2050 ml  Net 351.37 ml   [employer]   05/20/23  0328  Weight: 108.9 kg    Examination:  General exam: Patient looks chronically ill looking.  Minimally responsive.   Respiratory system:  Clear to auscultation.  Cardiovascular system: S1 & S2 heard Gastrointestinal system: Abdomen is soft and nontender. Central nervous system: Minimally responsive.  Data Reviewed: I have personally reviewed following labs and imaging studies  CBC: Recent Labs  Lab 05/19/23 2040 05/20/23 0441 05/21/23 0536  WBC 9.7 10.5 12.2*  NEUTROABS 6.6  --   --   HGB 9.8* 9.9* 9.7*  HCT 33.4* 33.9* 33.1*  MCV 95.4 94.2 97.9  PLT 152 169 160   Basic Metabolic Panel: Recent Labs  Lab 05/19/23 2040 05/20/23 0441 05/20/23 1652 05/20/23 2222 05/21/23 0536  NA 154* 155* 155* 154* 150*  K 3.2* 3.9 3.4* 3.1* 4.3  CL 121* 122* 121* 123* 121*  CO2 24 22 24  21* 18*  GLUCOSE 211* 218* 192* 160* 156*  BUN 64* 63* 51* 45* 39*  CREATININE 3.06* 2.42* 1.76* 1.58* 1.46*  CALCIUM 8.2* 9.1 9.2 9.1 9.0  MG  --  1.8  --  1.7  --   PHOS  --   --  2.5 2.2* 2.3*   GFR: Estimated Creatinine Clearance: 50.6 mL/min (A) (by C-G formula based on SCr of 1.46 mg/dL (H)). Liver Function Tests: Recent Labs  Lab 05/19/23 2040 05/20/23 0441 05/20/23 1652 05/20/23 2222 05/21/23 0536  AST 25 27  --   --   --   ALT 18 18  --   --   --   ALKPHOS 316* 349*  --   --   --   BILITOT 0.6 0.6  --   --   --   PROT 5.8* 6.7  --   --   --   ALBUMIN 2.2* 2.4* 2.3* 2.2* 2.1*   No results for input(s): "LIPASE", "AMYLASE" in the last 168 hours. No results for input(s): "AMMONIA" in the last 168 hours. Coagulation Profile: Recent Labs  Lab 05/19/23 2040  INR 1.4*   Cardiac Enzymes: Recent Labs  Lab 05/20/23 0441  CKTOTAL 866*   BNP (last 3 results) No results for input(s): "PROBNP" in the last 8760 hours. HbA1C: Recent Labs    05/20/23 0441  HGBA1C 7.6*   CBG: Recent Labs  Lab 05/21/23 0221 05/21/23 0715 05/21/23 1139 05/21/23 1843  GLUCAP 140* 88 105* 118*   Lipid Profile: No results for input(s): "CHOL", "HDL", "LDLCALC", "TRIG", "CHOLHDL", "LDLDIRECT" in the last 72 hours. Thyroid Function  Tests: Recent Labs    05/20/23 0640  TSH 0.492   Anemia Panel: No results for input(s): "VITAMINB12", "FOLATE", "FERRITIN", "TIBC", "IRON", "RETICCTPCT" in the last 72 hours. Urine analysis:    Component Value Date/Time   COLORURINE AMBER (A) 05/20/2023 0400   APPEARANCEUR HAZY (A) 05/20/2023 0400   LABSPEC 1.017 05/20/2023 0400   PHURINE 5.0 05/20/2023 0400   GLUCOSEU NEGATIVE 05/20/2023 0400   HGBUR LARGE (A) 05/20/2023 0400   BILIRUBINUR NEGATIVE 05/20/2023 0400   KETONESUR NEGATIVE 05/20/2023 0400   PROTEINUR NEGATIVE 05/20/2023 0400   NITRITE NEGATIVE 05/20/2023 0400   LEUKOCYTESUR NEGATIVE 05/20/2023 0400   Sepsis Labs: @LABRCNTIP (procalcitonin:4,lacticidven:4)  ) Recent Results (from the past 240 hours)  Blood Culture (routine x 2)     Status: None (Preliminary result)   Collection Time: 05/19/23  8:40 PM   Specimen: BLOOD  Result Value Ref Range Status   Specimen Description BLOOD RIGHT ANTECUBITAL  Final   Special Requests   Final  BOTTLES DRAWN AEROBIC ONLY Blood Culture results may not be optimal due to an excessive volume of blood received in culture bottles   Culture   Final    NO GROWTH 2 DAYS Performed at [facility], [address], [place], [place] [address]    Report Status PENDING  Incomplete  Resp panel by RT-PCR (RSV, Flu A&B, Covid) Anterior Nasal Swab     Status: None   Collection Time: 05/19/23 10:30 PM   Specimen: Anterior Nasal Swab  Result Value Ref Range Status   SARS Coronavirus 2 by RT PCR NEGATIVE NEGATIVE Final   Influenza A by PCR NEGATIVE NEGATIVE Final   Influenza B by PCR NEGATIVE NEGATIVE Final    Comment: (NOTE) The Xpert Xpress SARS-CoV-2/FLU/RSV plus assay is intended as an aid in the diagnosis of influenza from Nasopharyngeal swab specimens and should not be used as a sole basis for treatment. Nasal washings and aspirates are unacceptable for Xpert Xpress SARS-CoV-2/FLU/RSV testing.  Fact Sheet for  Patients: BloggerCourse.com  Fact Sheet for Healthcare Providers: SeriousBroker.it  This test is not yet approved or cleared by the [place] FDA and has been authorized for detection and/or diagnosis of SARS-CoV-2 by FDA under an Emergency Use Authorization (EUA). This EUA will remain in effect (meaning this test can be used) for the duration of the COVID-19 declaration under Section 564(b)(1) of the Act, 21 U.S.C. section 360bbb-3(b)(1), unless the authorization is terminated or revoked.     Resp Syncytial Virus by PCR NEGATIVE NEGATIVE Final    Comment: (NOTE) Fact Sheet for Patients: BloggerCourse.com  Fact Sheet for Healthcare Providers: SeriousBroker.it  This test is not yet approved or cleared by the [place] FDA and has been authorized for detection and/or diagnosis of SARS-CoV-2 by FDA under an Emergency Use Authorization (EUA). This EUA will remain in effect (meaning this test can be used) for the duration of the COVID-19 declaration under Section 564(b)(1) of the Act, 21 U.S.C. section 360bbb-3(b)(1), unless the authorization is terminated or revoked.  Performed at [facility], [address] [place], [place] [address]   Urine Culture     Status: Abnormal (Preliminary result)   Collection Time: 05/20/23  4:00 AM   Specimen: Urine, Random  Result Value Ref Range Status   Specimen Description URINE, RANDOM  Final   Special Requests NONE Reflexed from [id number]  Final   Culture (A)  Final    >=100,000 COLONIES/mL ESCHERICHIA COLI SUSCEPTIBILITIES TO FOLLOW Performed at [facility], [address] [place], [place] [address]    Report Status PENDING  Incomplete         Radiology Studies: EEG adult Result Date: 05/20/2023 Charlsie Quest, MD     05/20/2023 10:49 AM Patient Name: Patricia Perales MRN: [id number] Epilepsy Attending: Charlsie Quest Referring Physician/Provider: Lurline Del, MD Date: 05/20/2023 Duration: 23.47 mins Patient history: 83yo M with ams getting eeg to evaluate for seizure Level of alertness: Awake AEDs during EEG study: LEV, OXC Technical aspects: This EEG study was done with scalp electrodes positioned according to the 10-20 International system of electrode placement. Electrical activity was reviewed with band pass filter of 1-70Hz , sensitivity of 7 uV/mm, display speed of 35mm/sec with a 60Hz  notched filter applied as appropriate. EEG data were recorded continuously and digitally stored.  Video monitoring was available and reviewed as appropriate. Description: EEG showed continuous generalized 3 to 6 Hz theta-delta slowing. Hyperventilation and photic stimulation were not performed.  ABNORMALITY - Continuous slow, generalized IMPRESSION: This study is suggestive of moderate diffuse encephalopathy. No seizures or epileptiform discharges were seen throughout the recording. Charlsie Quest   DG Ankle 2 Views Right Result Date: 05/20/2023 CLINICAL DATA:  83 year old male with history of abscess. EXAM: RIGHT ANKLE - 2 VIEW COMPARISON:  No priors. FINDINGS: Two views of the right ankle demonstrate no acute displaced fracture, subluxation or dislocation. No definite destructive bony lesions. Soft tissues are grossly unremarkable. IMPRESSION: 1. No acute radiographic abnormality of the right ankle. Electronically Signed   By: Trudie Reed M.D.   On: 05/20/2023 06:25   CT ABDOMEN PELVIS WO CONTRAST Result Date: 05/20/2023 CLINICAL DATA:  Abdominal pain. EXAM: CT ABDOMEN AND PELVIS WITHOUT CONTRAST TECHNIQUE: Multidetector CT imaging of the abdomen and pelvis was performed following the standard protocol without IV contrast. RADIATION DOSE REDUCTION: This exam was performed according to the departmental dose-optimization program which includes automated exposure control, adjustment of the mA and/or kV according to  patient size and/or use of iterative reconstruction technique. COMPARISON:  None Available. FINDINGS: Lower chest: There is atelectasis in the lung bases. Hepatobiliary: There is a 6.9 by 6.4 cm hypodense lesion in the right lobe of the liver measuring 9 Hounsfield units. The gallbladder and bile ducts are within normal limits. Pancreas: Unremarkable. No pancreatic ductal dilatation or surrounding inflammatory changes. Spleen: Normal in size without focal abnormality. Adrenals/Urinary Tract: There is a punctate nonobstructing right renal calculus. No ureteral or bladder calculi are seen. No hydronephrosis. The adrenal glands are within normal limits. Bladder is within normal limits, displaced into the anterior abdomen. Stomach/Bowel: There is a large amount of stool dilating the rectosigmoid colon with wall thickening and surrounding inflammatory stranding. There is no bowel obstruction, pneumatosis or free air. The appendix is not visualized. Small bowel and stomach are within normal limits. Vascular/Lymphatic: No significant vascular findings are present. No enlarged abdominal or pelvic lymph nodes. Reproductive: Prostate is unremarkable. Other: There small fat containing inguinal and umbilical hernias. 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 IMPRESSION: 1. Large amount of stool dilating the rectosigmoid colon with wall thickening and surrounding inflammatory stranding compatible with stercoral colitis. 2. Nonobstructing right renal calculus. 3. 6.9 cm hypodense lesion in the right lobe of the liver, likely a cyst. This can be confirmed with ultrasound. 4. 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 Electronically Signed   By: Darliss Cheney M.D.   On: 05/20/2023 00:19   CT Head Wo Contrast Result Date: 05/20/2023 CLINICAL DATA:  Mental status change, unknown cause EXAM: CT HEAD WITHOUT CONTRAST TECHNIQUE:  Contiguous axial images were obtained from the base of the skull through the vertex without intravenous contrast. RADIATION DOSE REDUCTION: This exam was performed according to the departmental dose-optimization program which includes automated exposure control, adjustment of the mA and/or kV according to patient size and/or use of iterative reconstruction technique. COMPARISON:  CT head August 16, 2017. FINDINGS: Brain: No evidence of acute large vascular territory infarction, hemorrhage, hydrocephalus, extra-axial collection or mass lesion/mass effect. Temporal lobe predominant cerebral atrophy. Vascular: No hyperdense vessel. Skull: No acute fracture. Sinuses/Orbits: Opacified right maxillary sinus with surrounding osteitis, suggestive of chronic sinusitis. No acute orbital finding Other: No mastoid effusions. IMPRESSION: 1. No evidence of acute intracranial abnormality. 2. Temporal lobe predominant cerebral atrophy, which can be seen in Alzheimer's type dementia. 3. Chronic right maxillary sinusitis. Electronically Signed   By: Thornton Dales  Yetta Barre M.D.   On: 05/20/2023 00:16        Scheduled Meds:  Chlorhexidine Gluconate Cloth  6 each Topical Daily   heparin  5,000 Units Subcutaneous Q8H   OXcarbazepine  150 mg Oral QHS   Oxcarbazepine  300 mg Oral Daily   Continuous Infusions:  sodium chloride 125 mL/hr at 05/21/23 1745   cefTRIAXone (ROCEPHIN)  IV Stopped (05/21/23 1610)   levETIRAcetam Stopped (05/21/23 1058)   vancomycin Stopped (05/21/23 0927)     LOS: 1 day    Time spent: 35 minutes    Berton Mount, MD  [facility] Pager #: [phone] 7PM-7AM contact night coverage as above

## 2023-05-21 NOTE — Progress Notes (Signed)
Patient is having persistent tachycardia with frequent PVCs and 3 beats SVT.MD aware.No any interventions done at present.

## 2023-05-21 NOTE — Plan of Care (Signed)

## 2023-05-21 NOTE — ED Notes (Signed)
ED TO INPATIENT HANDOFF REPORT  ED Nurse Name and Phone #: [phone] Christopher Hobbs Y   S Name/Age/Gender Christopher Hobbs 83 y.o. male Room/Bed: 004C/004C  Code Status   Code Status: Limited: Do not attempt resuscitation (DNR) -DNR-LIMITED -Do Not Intubate/DNI   Home/SNF/Other Home Patient oriented to: self Is this baseline? Yes   Triage Complete: Triage complete  Chief Complaint AKI (acute kidney injury) (HCC) [N17.9]  Triage Note Patient BIB [facility] for Hypotension and altered mental status. Facility noted decline in the past week. Dementia hx, responsive to pain. Nonambulatory.  80/50 prior to 500 ml Saline bolus by EMS. 20G R AC. 90/58 recheck.  348 CBG    Allergies No Known Allergies  Level of Care/Admitting Diagnosis ED Disposition     ED Disposition  Admit   Condition  --   Comment  Hospital Area: [facility] [[id number]]  Level of Care: Progressive [102]  Admit to Progressive based on following criteria: MULTISYSTEM THREATS such as stable sepsis, metabolic/electrolyte imbalance with or without encephalopathy that is responding to early treatment.  May admit patient to [facility] or [facility] if equivalent level of care is available:: No  Covid Evaluation: Confirmed COVID Negative  Diagnosis: AKI (acute kidney injury) [facility]) [[id number]]  Admitting Physician: Lurline Del [[id number]]  Attending Physician: Lurline Del [[id number]]  Certification:: I certify this patient will need inpatient services for at least 2 midnights  Expected Medical Readiness: 05/23/2023          B Medical/Surgery History Past Medical History:  Diagnosis Date   Dementia (HCC)    Seizures (HCC)    History reviewed. No pertinent surgical history.   A IV Location/Drains/Wounds Patient Lines/Drains/Airways Status     Active Line/Drains/Airways     Name Placement date Placement time Site Days   Peripheral IV 08/02/22 18 G Anterior;Right Forearm 08/02/22  1354  Forearm   292   Peripheral IV 05/19/23 22 G Right Antecubital 05/19/23  2317  Antecubital  2   Urethral Catheter Megan RN Straight-tip 16 Fr. 05/20/23  0403  Straight-tip  1   Fecal Management System 40 mL 05/20/23  2258  -- 1            Intake/Output Last 24 hours  Intake/Output Summary (Last 24 hours) at 05/21/2023 1128 Last data filed at 05/21/2023 6295 Gross per 24 hour  Intake 2901.37 ml  Output 1100 ml  Net 1801.37 ml    Labs/Imaging Results for orders placed or performed during the hospital encounter of 05/19/23 (from the past 48 hours)  Comprehensive metabolic panel     Status: Abnormal   Collection Time: 05/19/23  8:40 PM  Result Value Ref Range   Sodium 154 (H) 135 - 145 mmol/L   Potassium 3.2 (L) 3.5 - 5.1 mmol/L   Chloride 121 (H) 98 - 111 mmol/L   CO2 24 22 - 32 mmol/L   Glucose, Bld 211 (H) 70 - 99 mg/dL    Comment: Glucose reference range applies only to samples taken after fasting for at least 8 hours.   BUN 64 (H) 8 - 23 mg/dL   Creatinine, Ser 2.84 (H) 0.61 - 1.24 mg/dL   Calcium 8.2 (L) 8.9 - 10.3 mg/dL   Total Protein 5.8 (L) 6.5 - 8.1 g/dL   Albumin 2.2 (L) 3.5 - 5.0 g/dL   AST 25 15 - 41 U/L   ALT 18 0 - 44 U/L   Alkaline Phosphatase 316 (H) 38 - 126 U/L  Total Bilirubin 0.6 0.0 - 1.2 mg/dL   GFR, Estimated 20 (L) >60 mL/min    Comment: (NOTE) Calculated using the CKD-EPI Creatinine Equation (2021)    Anion gap 9 5 - 15    Comment: Performed at [facility], [address] [place], [place] [address]  CBC with Differential     Status: Abnormal   Collection Time: 05/19/23  8:40 PM  Result Value Ref Range   WBC 9.7 4.0 - 10.5 K/uL   RBC 3.50 (L) 4.22 - 5.81 MIL/uL   Hemoglobin 9.8 (L) 13.0 - 17.0 g/dL   HCT 62.9 (L) 52.8 - 41.3 %   MCV 95.4 80.0 - 100.0 fL   MCH 28.0 26.0 - 34.0 pg   MCHC 29.3 (L) 30.0 - 36.0 g/dL   RDW 24.4 01.0 - 27.2 %   Platelets 152 150 - 400 K/uL   nRBC 0.0 0.0 - 0.2 %   Neutrophils Relative % 69 %   Neutro Abs 6.6 1.7 -  7.7 K/uL   Lymphocytes Relative 19 %   Lymphs Abs 1.9 0.7 - 4.0 K/uL   Monocytes Relative 12 %   Monocytes Absolute 1.2 (H) 0.1 - 1.0 K/uL   Eosinophils Relative 0 %   Eosinophils Absolute 0.0 0.0 - 0.5 K/uL   Basophils Relative 0 %   Basophils Absolute 0.0 0.0 - 0.1 K/uL   WBC Morphology MORPHOLOGY UNREMARKABLE    RBC Morphology MORPHOLOGY UNREMARKABLE    Smear Review Normal platelet morphology    Immature Granulocytes 0 %   Abs Immature Granulocytes 0.03 0.00 - 0.07 K/uL    Comment: Performed at [facility], [address] [place], [place] [address]  Protime-INR     Status: Abnormal   Collection Time: 05/19/23  8:40 PM  Result Value Ref Range   Prothrombin Time 17.7 (H) 11.4 - 15.2 seconds   INR 1.4 (H) 0.8 - 1.2    Comment: (NOTE) INR goal varies based on device and disease states. Performed at [facility], [address], [place], [place] [address]   APTT     Status: None   Collection Time: 05/19/23  8:40 PM  Result Value Ref Range   aPTT 33 24 - 36 seconds    Comment: Performed at [facility], [address], [place], [place] [address]  Blood Culture (routine x 2)     Status: None (Preliminary result)   Collection Time: 05/19/23  8:40 PM   Specimen: BLOOD  Result Value Ref Range   Specimen Description BLOOD RIGHT ANTECUBITAL    Special Requests      BOTTLES DRAWN AEROBIC ONLY Blood Culture results may not be optimal due to an excessive volume of blood received in culture bottles   Culture      NO GROWTH 2 DAYS Performed at [facility], [address] [place], [place] [address]    Report Status PENDING   I-Stat Lactic Acid, ED     Status: Abnormal   Collection Time: 05/19/23  8:45 PM  Result Value Ref Range   Lactic Acid, Venous 2.9 (HH) 0.5 - 1.9 mmol/L   Comment NOTIFIED PHYSICIAN   Resp panel by RT-PCR (RSV, Flu A&B, Covid) Anterior Nasal Swab     Status: None   Collection Time: 05/19/23 10:30 PM   Specimen: Anterior Nasal Swab   Result Value Ref Range   SARS Coronavirus 2 by RT PCR NEGATIVE NEGATIVE   Influenza A  by PCR NEGATIVE NEGATIVE   Influenza B by PCR NEGATIVE NEGATIVE    Comment: (NOTE) The Xpert Xpress SARS-CoV-2/FLU/RSV plus assay is intended as an aid in the diagnosis of influenza from Nasopharyngeal swab specimens and should not be used as a sole basis for treatment. Nasal washings and aspirates are unacceptable for Xpert Xpress SARS-CoV-2/FLU/RSV testing.  Fact Sheet for Patients: BloggerCourse.com  Fact Sheet for Healthcare Providers: SeriousBroker.it  This test is not yet approved or cleared by the [place] FDA and has been authorized for detection and/or diagnosis of SARS-CoV-2 by FDA under an Emergency Use Authorization (EUA). This EUA will remain in effect (meaning this test can be used) for the duration of the COVID-19 declaration under Section 564(b)(1) of the Act, 21 U.S.C. section 360bbb-3(b)(1), unless the authorization is terminated or revoked.     Resp Syncytial Virus by PCR NEGATIVE NEGATIVE    Comment: (NOTE) Fact Sheet for Patients: BloggerCourse.com  Fact Sheet for Healthcare Providers: SeriousBroker.it  This test is not yet approved or cleared by the [place] FDA and has been authorized for detection and/or diagnosis of SARS-CoV-2 by FDA under an Emergency Use Authorization (EUA). This EUA will remain in effect (meaning this test can be used) for the duration of the COVID-19 declaration under Section 564(b)(1) of the Act, 21 U.S.C. section 360bbb-3(b)(1), unless the authorization is terminated or revoked.  Performed at [facility], [address] [place], [place] [address]   I-Stat Lactic Acid, ED     Status: Abnormal   Collection Time: 05/20/23 12:27 AM  Result Value Ref Range   Lactic Acid, Venous 2.8 (HH) 0.5 - 1.9 mmol/L   Comment NOTIFIED  PHYSICIAN   Urinalysis, w/ Reflex to Culture (Infection Suspected) -Urine, Catheterized     Status: Abnormal   Collection Time: 05/20/23 12:50 AM  Result Value Ref Range   Specimen Source URINE, CATHETERIZED    Color, Urine AMBER (A) YELLOW    Comment: BIOCHEMICALS MAY BE AFFECTED BY COLOR   APPearance HAZY (A) CLEAR   Specific Gravity, Urine 1.018 1.005 - 1.030   pH 5.0 5.0 - 8.0   Glucose, UA NEGATIVE NEGATIVE mg/dL   Hgb urine dipstick MODERATE (A) NEGATIVE   Bilirubin Urine NEGATIVE NEGATIVE   Ketones, ur NEGATIVE NEGATIVE mg/dL   Protein, ur 30 (A) NEGATIVE mg/dL   Nitrite NEGATIVE NEGATIVE   Leukocytes,Ua NEGATIVE NEGATIVE   RBC / HPF 6-10 0 - 5 RBC/hpf   WBC, UA 0-5 0 - 5 WBC/hpf    Comment:        Reflex urine culture not performed if WBC <=10, OR if Squamous epithelial cells >5. If Squamous epithelial cells >5 suggest recollection.    Bacteria, UA MANY (A) NONE SEEN   Squamous Epithelial / HPF 0-5 0 - 5 /HPF   Mucus PRESENT     Comment: Performed at [facility], [address], [place], [place] [address]  Urinalysis, w/ Reflex to Culture (Infection Suspected) -Urine, Clean Catch     Status: Abnormal   Collection Time: 05/20/23  4:00 AM  Result Value Ref Range   Specimen Source URINE, CLEAN CATCH    Color, Urine AMBER (A) YELLOW    Comment: BIOCHEMICALS MAY BE AFFECTED BY COLOR   APPearance HAZY (A) CLEAR   Specific Gravity, Urine 1.017 1.005 - 1.030   pH 5.0 5.0 - 8.0   Glucose, UA NEGATIVE NEGATIVE mg/dL   Hgb urine dipstick LARGE (A) NEGATIVE   Bilirubin  Urine NEGATIVE NEGATIVE   Ketones, ur NEGATIVE NEGATIVE mg/dL   Protein, ur NEGATIVE NEGATIVE mg/dL   Nitrite NEGATIVE NEGATIVE   Leukocytes,Ua NEGATIVE NEGATIVE   RBC / HPF 21-50 0 - 5 RBC/hpf   WBC, UA 21-50 0 - 5 WBC/hpf    Comment:        Reflex urine culture not performed if WBC <=10, OR if Squamous epithelial cells >5. If Squamous epithelial cells >5 suggest recollection.    Bacteria, UA  RARE (A) NONE SEEN   Squamous Epithelial / HPF 0-5 0 - 5 /HPF   Mucus PRESENT    Hyaline Casts, UA PRESENT     Comment: Performed at [facility], [address] [place], [place] [address]  Urine Culture     Status: Abnormal (Preliminary result)   Collection Time: 05/20/23  4:00 AM   Specimen: Urine, Random  Result Value Ref Range   Specimen Description URINE, RANDOM    Special Requests NONE Reflexed from [id number]    Culture (A)     >=100,000 COLONIES/mL GRAM NEGATIVE RODS SUSCEPTIBILITIES TO FOLLOW Performed at [facility], [address], [place], [place] [address]    Report Status PENDING   CBC     Status: Abnormal   Collection Time: 05/20/23  4:41 AM  Result Value Ref Range   WBC 10.5 4.0 - 10.5 K/uL   RBC 3.60 (L) 4.22 - 5.81 MIL/uL   Hemoglobin 9.9 (L) 13.0 - 17.0 g/dL   HCT 56.2 (L) 13.0 - 86.5 %   MCV 94.2 80.0 - 100.0 fL   MCH 27.5 26.0 - 34.0 pg   MCHC 29.2 (L) 30.0 - 36.0 g/dL   RDW 78.4 69.6 - 29.5 %   Platelets 169 150 - 400 K/uL   nRBC 0.0 0.0 - 0.2 %    Comment: Performed at [facility], [address], [place], [place] [address]  Comprehensive metabolic panel     Status: Abnormal   Collection Time: 05/20/23  4:41 AM  Result Value Ref Range   Sodium 155 (H) 135 - 145 mmol/L   Potassium 3.9 3.5 - 5.1 mmol/L   Chloride 122 (H) 98 - 111 mmol/L   CO2 22 22 - 32 mmol/L   Glucose, Bld 218 (H) 70 - 99 mg/dL    Comment: Glucose reference range applies only to samples taken after fasting for at least 8 hours.   BUN 63 (H) 8 - 23 mg/dL   Creatinine, Ser 2.44 (H) 0.61 - 1.24 mg/dL   Calcium 9.1 8.9 - 01.0 mg/dL   Total Protein 6.7 6.5 - 8.1 g/dL   Albumin 2.4 (L) 3.5 - 5.0 g/dL   AST 27 15 - 41 U/L   ALT 18 0 - 44 U/L   Alkaline Phosphatase 349 (H) 38 - 126 U/L   Total Bilirubin 0.6 0.0 - 1.2 mg/dL   GFR, Estimated 26 (L) >60 mL/min    Comment: (NOTE) Calculated using the CKD-EPI Creatinine Equation (2021)    Anion gap 11 5 - 15    Comment:  Performed at [facility], [address] [place], [place] [address]  Magnesium     Status: None   Collection Time: 05/20/23  4:41 AM  Result Value Ref Range   Magnesium 1.8 1.7 - 2.4 mg/dL    Comment: Performed at [facility], [address] [place], [place] [address]  CK     Status: Abnormal  Collection Time: 05/20/23  4:41 AM  Result Value Ref Range   Total CK 866 (H) 49 - 397 U/L    Comment: Performed at [facility], [address] [place], [place] [address]  Blood gas, venous     Status: None   Collection Time: 05/20/23  4:41 AM  Result Value Ref Range   pH, Ven 7.34 7.25 - 7.43   pCO2, Ven 50 44 - 60 mmHg   pO2, Ven 36 32 - 45 mmHg   Bicarbonate 27.0 20.0 - 28.0 mmol/L   Acid-Base Excess 0.5 0.0 - 2.0 mmol/L   O2 Saturation 58.6 %   Patient temperature 37.0     Comment: Performed at [facility], [address] [place], [place] [address]  Hemoglobin A1c     Status: Abnormal   Collection Time: 05/20/23  4:41 AM  Result Value Ref Range   Hgb A1c MFr Bld 7.6 (H) 4.8 - 5.6 %    Comment: (NOTE) Pre diabetes:          5.7%-6.4%  Diabetes:              >6.4%  Glycemic control for   <7.0% adults with diabetes    Mean Plasma Glucose 171.42 mg/dL    Comment: Performed at [facility], [address], [place], [place] [address]  C-reactive protein     Status: Abnormal   Collection Time: 05/20/23  6:40 AM  Result Value Ref Range   CRP 12.2 (H) <1.0 mg/dL    Comment: Performed at [facility], [address], [place], [place] [address]  Lactic acid, plasma     Status: Abnormal   Collection Time: 05/20/23  6:40 AM  Result Value Ref Range   Lactic Acid, Venous 2.0 (HH) 0.5 - 1.9 mmol/L    Comment: CRITICAL RESULT CALLED TO, READ BACK BY AND VERIFIED WITH SHANNON DONELLEY RN.@[id number]  ON 1.31.25 BY TCALDWELL MT. Performed at [facility], [address], [place], [place] [address]   Procalcitonin     Status: None   Collection Time:  05/20/23  6:40 AM  Result Value Ref Range   Procalcitonin 0.25 ng/mL    Comment:        Interpretation: PCT (Procalcitonin) <= 0.5 ng/mL: Systemic infection (sepsis) is not likely. Local bacterial infection is possible. (NOTE)       Sepsis PCT Algorithm           Lower Respiratory Tract                                      Infection PCT Algorithm    ----------------------------     ----------------------------         PCT < 0.25 ng/mL                PCT < 0.10 ng/mL          Strongly encourage             Strongly discourage   discontinuation of antibiotics    initiation of antibiotics    ----------------------------     -----------------------------       PCT 0.25 - 0.50 ng/mL            PCT 0.10 - 0.25 ng/mL               OR       >  80% decrease in PCT            Discourage initiation of                                            antibiotics      Encourage discontinuation           of antibiotics    ----------------------------     -----------------------------         PCT >= 0.50 ng/mL              PCT 0.26 - 0.50 ng/mL               AND        <80% decrease in PCT             Encourage initiation of                                             antibiotics       Encourage continuation           of antibiotics    ----------------------------     -----------------------------        PCT >= 0.50 ng/mL                  PCT > 0.50 ng/mL               AND         increase in PCT                  Strongly encourage                                      initiation of antibiotics    Strongly encourage escalation           of antibiotics                                     -----------------------------                                           PCT <= 0.25 ng/mL                                                 OR                                        > 80% decrease in PCT                                      Discontinue / Do not initiate  antibiotics  Performed at [facility], [address] [place], [place] [address]   TSH     Status: None   Collection Time: 05/20/23  6:40 AM  Result Value Ref Range   TSH 0.492 0.350 - 4.500 uIU/mL    Comment: Performed by a 3rd Generation assay with a functional sensitivity of <=0.01 uIU/mL. Performed at [facility], [address], [place], [place] [address]   Lactic acid, plasma     Status: Abnormal   Collection Time: 05/20/23  4:52 PM  Result Value Ref Range   Lactic Acid, Venous 2.1 (HH) 0.5 - 1.9 mmol/L    Comment: CRITICAL VALUE NOTED. VALUE IS CONSISTENT WITH PREVIOUSLY REPORTED/CALLED VALUE Performed at [facility], [address], [place], [place] [address]   Renal function panel     Status: Abnormal   Collection Time: 05/20/23  4:52 PM  Result Value Ref Range   Sodium 155 (H) 135 - 145 mmol/L   Potassium 3.4 (L) 3.5 - 5.1 mmol/L   Chloride 121 (H) 98 - 111 mmol/L   CO2 24 22 - 32 mmol/L   Glucose, Bld 192 (H) 70 - 99 mg/dL    Comment: Glucose reference range applies only to samples taken after fasting for at least 8 hours.   BUN 51 (H) 8 - 23 mg/dL   Creatinine, Ser 9.14 (H) 0.61 - 1.24 mg/dL   Calcium 9.2 8.9 - 78.2 mg/dL   Phosphorus 2.5 2.5 - 4.6 mg/dL   Albumin 2.3 (L) 3.5 - 5.0 g/dL   GFR, Estimated 38 (L) >60 mL/min    Comment: (NOTE) Calculated using the CKD-EPI Creatinine Equation (2021)    Anion gap 10 5 - 15    Comment: Performed at [facility], [address], [place], [place] [address]  Renal function panel     Status: Abnormal   Collection Time: 05/20/23 10:22 PM  Result Value Ref Range   Sodium 154 (H) 135 - 145 mmol/L   Potassium 3.1 (L) 3.5 - 5.1 mmol/L   Chloride 123 (H) 98 - 111 mmol/L   CO2 21 (L) 22 - 32 mmol/L   Glucose, Bld 160 (H) 70 - 99 mg/dL    Comment: Glucose reference range applies only to samples taken after fasting for at least 8 hours.   BUN 45 (H) 8 - 23 mg/dL   Creatinine, Ser 3.08 (H) 0.61 -  1.24 mg/dL   Calcium 9.1 8.9 - 65.7 mg/dL   Phosphorus 2.2 (L) 2.5 - 4.6 mg/dL   Albumin 2.2 (L) 3.5 - 5.0 g/dL   GFR, Estimated 44 (L) >60 mL/min    Comment: (NOTE) Calculated using the CKD-EPI Creatinine Equation (2021)    Anion gap 10 5 - 15    Comment: Performed at [facility], [address] [place], [place] [address]  Lactic acid, plasma     Status: None   Collection Time: 05/20/23 10:22 PM  Result Value Ref Range   Lactic Acid, Venous 1.8 0.5 - 1.9 mmol/L    Comment: Performed at [facility], [address], [place], [place] [address]  Magnesium     Status: None   Collection Time: 05/20/23 10:22 PM  Result Value Ref Range   Magnesium 1.7 1.7 - 2.4 mg/dL    Comment: Performed at [facility], [address] [place], [place] [address]  CBG monitoring, ED     Status: Abnormal  Collection Time: 05/21/23  2:21 AM  Result Value Ref Range   Glucose-Capillary 140 (H) 70 - 99 mg/dL    Comment: Glucose reference range applies only to samples taken after fasting for at least 8 hours.  Lactic acid, plasma     Status: None   Collection Time: 05/21/23  2:23 AM  Result Value Ref Range   Lactic Acid, Venous 1.5 0.5 - 1.9 mmol/L    Comment: Performed at [facility], [address] [place], [place] [address]  Renal function panel     Status: Abnormal   Collection Time: 05/21/23  5:36 AM  Result Value Ref Range   Sodium 150 (H) 135 - 145 mmol/L   Potassium 4.3 3.5 - 5.1 mmol/L   Chloride 121 (H) 98 - 111 mmol/L   CO2 18 (L) 22 - 32 mmol/L   Glucose, Bld 156 (H) 70 - 99 mg/dL    Comment: Glucose reference range applies only to samples taken after fasting for at least 8 hours.   BUN 39 (H) 8 - 23 mg/dL   Creatinine, Ser 6.04 (H) 0.61 - 1.24 mg/dL   Calcium 9.0 8.9 - 54.0 mg/dL   Phosphorus 2.3 (L) 2.5 - 4.6 mg/dL   Albumin 2.1 (L) 3.5 - 5.0 g/dL   GFR, Estimated 48 (L) >60 mL/min    Comment: (NOTE) Calculated using the CKD-EPI Creatinine Equation  (2021)    Anion gap 11 5 - 15    Comment: Performed at [facility], [address] [place], [place] [address]  CBC     Status: Abnormal   Collection Time: 05/21/23  5:36 AM  Result Value Ref Range   WBC 12.2 (H) 4.0 - 10.5 K/uL   RBC 3.38 (L) 4.22 - 5.81 MIL/uL   Hemoglobin 9.7 (L) 13.0 - 17.0 g/dL   HCT 14.7 (L) 82.9 - 56.2 %   MCV 97.9 80.0 - 100.0 fL   MCH 28.7 26.0 - 34.0 pg   MCHC 29.3 (L) 30.0 - 36.0 g/dL   RDW 13.0 86.5 - 78.4 %   Platelets 160 150 - 400 K/uL   nRBC 0.0 0.0 - 0.2 %    Comment: Performed at [facility], [address], [place], [place] [address]  CBG monitoring, ED     Status: None   Collection Time: 05/21/23  7:15 AM  Result Value Ref Range   Glucose-Capillary 88 70 - 99 mg/dL    Comment: Glucose reference range applies only to samples taken after fasting for at least 8 hours.   EEG adult Result Date: 05/20/2023 Charlsie Quest, MD     05/20/2023 10:49 AM Patient Name: Christopher Hobbs MRN: [id number] Epilepsy Attending: Charlsie Quest Referring Physician/Provider: Lurline Del, MD Date: 05/20/2023 Duration: 23.47 mins Patient history: 84yo M with ams getting eeg to evaluate for seizure Level of alertness: Awake AEDs during EEG study: LEV, OXC Technical aspects: This EEG study was done with scalp electrodes positioned according to the 10-20 International system of electrode placement. Electrical activity was reviewed with band pass filter of 1-70Hz , sensitivity of 7 uV/mm, display speed of 63mm/sec with a 60Hz  notched filter applied as appropriate. EEG data were recorded continuously and digitally stored.  Video monitoring was available and reviewed as appropriate. Description: EEG showed continuous generalized 3 to 6 Hz theta-delta slowing. Hyperventilation and photic stimulation were not performed.   ABNORMALITY - Continuous slow, generalized IMPRESSION: This study is suggestive of moderate diffuse encephalopathy.  No seizures or epileptiform  discharges were seen throughout the recording. Charlsie Quest   DG Ankle 2 Views Right Result Date: 05/20/2023 CLINICAL DATA:  83 year old male with history of abscess. EXAM: RIGHT ANKLE - 2 VIEW COMPARISON:  No priors. FINDINGS: Two views of the right ankle demonstrate no acute displaced fracture, subluxation or dislocation. No definite destructive bony lesions. Soft tissues are grossly unremarkable. IMPRESSION: 1. No acute radiographic abnormality of the right ankle. Electronically Signed   By: Trudie Reed M.D.   On: 05/20/2023 06:25   CT ABDOMEN PELVIS WO CONTRAST Result Date: 05/20/2023 CLINICAL DATA:  Abdominal pain. EXAM: CT ABDOMEN AND PELVIS WITHOUT CONTRAST TECHNIQUE: Multidetector CT imaging of the abdomen and pelvis was performed following the standard protocol without IV contrast. RADIATION DOSE REDUCTION: This exam was performed according to the departmental dose-optimization program which includes automated exposure control, adjustment of the mA and/or kV according to patient size and/or use of iterative reconstruction technique. COMPARISON:  None Available. FINDINGS: Lower chest: There is atelectasis in the lung bases. Hepatobiliary: There is a 6.9 by 6.4 cm hypodense lesion in the right lobe of the liver measuring 9 Hounsfield units. The gallbladder and bile ducts are within normal limits. Pancreas: Unremarkable. No pancreatic ductal dilatation or surrounding inflammatory changes. Spleen: Normal in size without focal abnormality. Adrenals/Urinary Tract: There is a punctate nonobstructing right renal calculus. No ureteral or bladder calculi are seen. No hydronephrosis. The adrenal glands are within normal limits. Bladder is within normal limits, displaced into the anterior abdomen. Stomach/Bowel: There is a large amount of stool dilating the rectosigmoid colon with wall thickening and surrounding inflammatory stranding. There is no bowel obstruction, pneumatosis or free air. The appendix  is not visualized. Small bowel and stomach are within normal limits. Vascular/Lymphatic: No significant vascular findings are present. No enlarged abdominal or pelvic lymph nodes. Reproductive: Prostate is unremarkable. Other: There small fat containing inguinal and umbilical hernias. 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 IMPRESSION: 1. Large amount of stool dilating the rectosigmoid colon with wall thickening and surrounding inflammatory stranding compatible with stercoral colitis. 2. Nonobstructing right renal calculus. 3. 6.9 cm hypodense lesion in the right lobe of the liver, likely a cyst. This can be confirmed with ultrasound. 4. 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 Electronically Signed   By: Darliss Cheney M.D.   On: 05/20/2023 00:19   CT Head Wo Contrast Result Date: 05/20/2023 CLINICAL DATA:  Mental status change, unknown cause EXAM: CT HEAD WITHOUT CONTRAST TECHNIQUE: Contiguous axial images were obtained from the base of the skull through the vertex without intravenous contrast. RADIATION DOSE REDUCTION: This exam was performed according to the departmental dose-optimization program which includes automated exposure control, adjustment of the mA and/or kV according to patient size and/or use of iterative reconstruction technique. COMPARISON:  CT head August 16, 2017. FINDINGS: Brain: No evidence of acute large vascular territory infarction, hemorrhage, hydrocephalus, extra-axial collection or mass lesion/mass effect. Temporal lobe predominant cerebral atrophy. Vascular: No hyperdense vessel. Skull: No acute fracture. Sinuses/Orbits: Opacified right maxillary sinus with surrounding osteitis, suggestive of chronic sinusitis. No acute orbital finding Other: No mastoid effusions. IMPRESSION: 1. No evidence of acute intracranial abnormality. 2. 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 3. Chronic right maxillary sinusitis. Electronically Signed   By: Feliberto Harts M.D.   On: 05/20/2023 00:16   DG Chest Port 1  View Result Date: 05/19/2023 CLINICAL DATA:  Sepsis. EXAM: PORTABLE CHEST 1 VIEW COMPARISON:  August 02, 2022.  August 16, 2017. FINDINGS: The heart size and mediastinal contours are within normal limits. Both lungs are clear. Stable elevated left hemidiaphragm. Stable sclerotic density seen in the right scapula. IMPRESSION: No acute abnormality seen. Electronically Signed   By: Lupita Raider M.D.   On: 05/19/2023 18:29    Pending Labs Unresulted Labs (From admission, onward)     Start     Ordered   05/20/23 2143  Gastrointestinal Panel by PCR , Stool  (Gastrointestinal Panel by PCR, Stool                                                                                                                                                     **Does Not include CLOSTRIDIUM DIFFICILE testing. **If CDIFF testing is needed, place order from the "C Difficile Testing" order set.**)  Once,   R        05/20/23 2142   05/20/23 2143  C Difficile Quick Screen w PCR reflex  (C Difficile quick screen w PCR reflex panel )  Once, for 24 hours,   TIMED       References:    CDiff Information Tool   05/20/23 2142   05/20/23 1648  Renal function panel  Now then every 6 hours,   R      05/20/23 1647   05/19/23 1735  Blood Culture (routine x 2)  (Septic presentation on arrival (screening labs, nursing and treatment orders for obvious sepsis))  BLOOD CULTURE X 2,   STAT      05/19/23 1735            Vitals/Pain Today's Vitals   05/21/23 0815 05/21/23 0915 05/21/23 1015 05/21/23 1115  BP: 139/89 114/72 133/62 127/62  Pulse:  (!) 56 (!) 50 (!) 56  Resp: 20 17 20 19   Temp:      TempSrc:      SpO2:  100% 100% 100%  Weight:      Height:      PainSc:        Isolation Precautions Enteric precautions (UV disinfection)  Medications Medications   OXcarbazepine (TRILEPTAL) tablet 150 mg (150 mg Oral Not Given 05/20/23 2149)  Oxcarbazepine (TRILEPTAL) tablet 300 mg (300 mg Oral Given 05/21/23 1030)  heparin injection 5,000 Units (5,000 Units Subcutaneous Given 05/21/23 0529)  acetaminophen (TYLENOL) tablet 650 mg ( Oral See Alternative 05/20/23 2128)    Or  acetaminophen (TYLENOL) suppository 650 mg (650 mg Rectal Given 05/20/23 2128)  ondansetron (ZOFRAN) tablet 4 mg (has no administration in time range)    Or  ondansetron (ZOFRAN) injection 4 mg (has no administration in time range)  cefTRIAXone (ROCEPHIN) 1 g in sodium chloride 0.9 % 100 mL IVPB (0 g Intravenous  Stopped 05/21/23 0613)  vancomycin (VANCOCIN) 750 mg in sodium chloride 0.9 % 250 mL IVPB (0 mg Intravenous Stopped 05/21/23 0927)  free water 200 mL (200 mLs Per Tube Not Given 05/21/23 0924)  levETIRAcetam (KEPPRA) IVPB 500 mg/100 mL premix (0 mg Intravenous Stopped 05/21/23 1058)  0.45 % sodium chloride infusion ( Intravenous New Bag/Given 05/21/23 0815)  dextrose 50 % solution 50 mL (has no administration in time range)  sodium chloride 0.9 % bolus 3,000 mL (0 mLs Intravenous Stopped 05/19/23 2351)  potassium chloride 10 mEq in 100 mL IVPB (0 mEq Intravenous Stopped 05/20/23 0421)  sodium phosphate (FLEET) enema 1 enema (1 enema Rectal Given 05/20/23 0243)  cefTRIAXone (ROCEPHIN) 1 g in sodium chloride 0.9 % 100 mL IVPB (0 g Intravenous Stopped 05/20/23 0903)  vancomycin (VANCOREADY) IVPB 2000 mg/400 mL (0 mg Intravenous Stopped 05/20/23 0902)  sodium chloride 0.9 % bolus 500 mL (0 mLs Intravenous Stopped 05/20/23 1725)  sodium chloride 0.45 % bolus 1,000 mL (0 mLs Intravenous Stopped 05/20/23 2329)  metoprolol tartrate (LOPRESSOR) injection 2.5 mg (2.5 mg Intravenous Given 05/21/23 0328)  potassium chloride 10 mEq in 100 mL IVPB (0 mEq Intravenous Stopped 05/21/23 0601)    Mobility non-ambulatory     Focused Assessments    R Recommendations: See Admitting Provider Note  Report given  to:   Additional Notes:

## 2023-05-21 NOTE — Progress Notes (Signed)
Patient has an order for Q2 free water(Recent sodium level is 150).Patient is not alert enough to swallow anything orally and MD Berton Mount) recommended not to have any NG tube as the sodium level is trending down.Pt has 0.45% NS running at 172ml/hr.

## 2023-05-22 ENCOUNTER — Inpatient Hospital Stay (HOSPITAL_COMMUNITY): Payer: Medicare Other

## 2023-05-22 DIAGNOSIS — N179 Acute kidney failure, unspecified: Secondary | ICD-10-CM

## 2023-05-22 DIAGNOSIS — F0390 Unspecified dementia without behavioral disturbance: Secondary | ICD-10-CM | POA: Diagnosis not present

## 2023-05-22 DIAGNOSIS — E860 Dehydration: Secondary | ICD-10-CM | POA: Diagnosis not present

## 2023-05-22 DIAGNOSIS — K5289 Other specified noninfective gastroenteritis and colitis: Secondary | ICD-10-CM | POA: Diagnosis not present

## 2023-05-22 LAB — BASIC METABOLIC PANEL
Anion gap: 8 (ref 5–15)
Anion gap: 10 (ref 5–15)
Anion gap: 8 (ref 5–15)
Anion gap: 8 (ref 5–15)
Anion gap: 7 (ref 5–15)
BUN: 29 mg/dL — ABNORMAL HIGH (ref 8–23)
BUN: 28 mg/dL — ABNORMAL HIGH (ref 8–23)
BUN: 26 mg/dL — ABNORMAL HIGH (ref 8–23)
BUN: 26 mg/dL — ABNORMAL HIGH (ref 8–23)
BUN: 25 mg/dL — ABNORMAL HIGH (ref 8–23)
CO2: 18 mmol/L — ABNORMAL LOW (ref 22–32)
CO2: 20 mmol/L — ABNORMAL LOW (ref 22–32)
CO2: 20 mmol/L — ABNORMAL LOW (ref 22–32)
CO2: 20 mmol/L — ABNORMAL LOW (ref 22–32)
CO2: 18 mmol/L — ABNORMAL LOW (ref 22–32)
Calcium: 9.3 mg/dL (ref 8.9–10.3)
Calcium: 9.5 mg/dL (ref 8.9–10.3)
Calcium: 9.3 mg/dL (ref 8.9–10.3)
Calcium: 9.3 mg/dL (ref 8.9–10.3)
Calcium: 9.3 mg/dL (ref 8.9–10.3)
Chloride: 123 mmol/L — ABNORMAL HIGH (ref 98–111)
Chloride: 120 mmol/L — ABNORMAL HIGH (ref 98–111)
Chloride: 120 mmol/L — ABNORMAL HIGH (ref 98–111)
Chloride: 121 mmol/L — ABNORMAL HIGH (ref 98–111)
Chloride: 122 mmol/L — ABNORMAL HIGH (ref 98–111)
Creatinine, Ser: 1.23 mg/dL (ref 0.61–1.24)
Creatinine, Ser: 1.32 mg/dL — ABNORMAL HIGH (ref 0.61–1.24)
Creatinine, Ser: 1.29 mg/dL — ABNORMAL HIGH (ref 0.61–1.24)
Creatinine, Ser: 1.14 mg/dL (ref 0.61–1.24)
Creatinine, Ser: 1.24 mg/dL (ref 0.61–1.24)
GFR, Estimated: 59 mL/min — ABNORMAL LOW (ref >60)
GFR, Estimated: 54 mL/min — ABNORMAL LOW (ref >60)
GFR, Estimated: 56 mL/min — ABNORMAL LOW (ref >60)
GFR, Estimated: >60 mL/min (ref >60)
GFR, Estimated: 58 mL/min — ABNORMAL LOW (ref >60)
Glucose, Bld: 129 mg/dL — ABNORMAL HIGH (ref 70–99)
Glucose, Bld: 128 mg/dL — ABNORMAL HIGH (ref 70–99)
Glucose, Bld: 135 mg/dL — ABNORMAL HIGH (ref 70–99)
Glucose, Bld: 146 mg/dL — ABNORMAL HIGH (ref 70–99)
Glucose, Bld: 182 mg/dL — ABNORMAL HIGH (ref 70–99)
Potassium: 3.4 mmol/L — ABNORMAL LOW (ref 3.5–5.1)
Potassium: 3.3 mmol/L — ABNORMAL LOW (ref 3.5–5.1)
Potassium: 3.4 mmol/L — ABNORMAL LOW (ref 3.5–5.1)
Potassium: 3.4 mmol/L — ABNORMAL LOW (ref 3.5–5.1)
Potassium: 3.4 mmol/L — ABNORMAL LOW (ref 3.5–5.1)
Sodium: 149 mmol/L — ABNORMAL HIGH (ref 135–145)
Sodium: 150 mmol/L — ABNORMAL HIGH (ref 135–145)
Sodium: 148 mmol/L — ABNORMAL HIGH (ref 135–145)
Sodium: 149 mmol/L — ABNORMAL HIGH (ref 135–145)
Sodium: 147 mmol/L — ABNORMAL HIGH (ref 135–145)

## 2023-05-22 LAB — GASTROINTESTINAL PANEL BY PCR, STOOL (REPLACES STOOL CULTURE)

## 2023-05-22 LAB — URINE CULTURE: Culture: >=100000 — AB

## 2023-05-22 LAB — CBC
HCT: 31.8 % — ABNORMAL LOW (ref 39.0–52.0)
Hemoglobin: 9.6 g/dL — ABNORMAL LOW (ref 13.0–17.0)
MCH: 27.3 pg (ref 26.0–34.0)
MCHC: 30.2 g/dL (ref 30.0–36.0)
MCV: 90.3 fL (ref 80.0–100.0)
Platelets: 174 10*3/uL (ref 150–400)
RBC: 3.52 MIL/uL — ABNORMAL LOW (ref 4.22–5.81)
RDW: 15 % (ref 11.5–15.5)
WBC: 11.9 10*3/uL — ABNORMAL HIGH (ref 4.0–10.5)
nRBC: 0.4 % — ABNORMAL HIGH (ref 0.0–0.2)

## 2023-05-22 LAB — GLUCOSE, CAPILLARY
Glucose-Capillary: 124 mg/dL — ABNORMAL HIGH (ref 70–99)
Glucose-Capillary: 121 mg/dL — ABNORMAL HIGH (ref 70–99)
Glucose-Capillary: 131 mg/dL — ABNORMAL HIGH (ref 70–99)

## 2023-05-22 LAB — TSH: TSH: 1.682 u[IU]/mL (ref 0.350–4.500)

## 2023-05-22 MED ORDER — POTASSIUM CHLORIDE 10 MEQ/100ML IV SOLN
10.0000 meq | INTRAVENOUS | Status: AC
Start: 2023-05-22 — End: 2023-05-23
  Administered 2023-05-22 (×3): 10 meq via INTRAVENOUS
  Filled 2023-05-22 (×3): qty 100

## 2023-05-22 MED ORDER — METRONIDAZOLE 500 MG/100ML IV SOLN
500.0000 mg | Freq: Two times a day (BID) | INTRAVENOUS | Status: DC
  Administered 2023-05-22 – 2023-05-23 (×4): 500 mg via INTRAVENOUS
  Filled 2023-05-22 (×4): qty 100

## 2023-05-22 MED ORDER — DEXTROSE 5 % IV SOLN: INTRAVENOUS | Status: AC

## 2023-05-22 MED ORDER — METOPROLOL TARTRATE 12.5 MG HALF TABLET: 12.5000 mg | ORAL_TABLET | Freq: Two times a day (BID) | ORAL | Status: DC

## 2023-05-22 MED ORDER — METOPROLOL TARTRATE 5 MG/5ML IV SOLN
10.0000 mg | Freq: Two times a day (BID) | INTRAVENOUS | Status: DC
  Administered 2023-05-22: 10 mg via INTRAVENOUS
  Filled 2023-05-22: qty 10

## 2023-05-22 MED ORDER — METOPROLOL TARTRATE 5 MG/5ML IV SOLN: 5.0000 mg | Freq: Two times a day (BID) | INTRAVENOUS | Status: DC

## 2023-05-22 NOTE — Plan of Care (Signed)

## 2023-05-22 NOTE — Progress Notes (Addendum)
PROGRESS NOTE        PATIENT DETAILS Name: Christopher Hobbs Age: 83 y.o. Sex: male Date of Birth: 1940-10-13 Admit Date: 05/19/2023 Admitting Physician Lurline Del, MD WUJ:[id number], Jerelyn Scott, MD  Brief Summary: Patient is a 83 y.o.  male with history of dementia, seizures-brought in from SNF for acute metabolic encephalopathy in the setting of AKI/hyponatremia due to failure to thrive syndrome/a UTI.  Significant events: 1/30>> admit to [facility]  Significant studies: 1/30>> CT head: No acute intracranial abnormality 1/30>> CT abdomen/pelvis: Large stool in rectosigmoid-stercoral colitis. 1/31>> EEG: No seizures 2/1>> x-ray abdomen: No SBO. 2/2>> CXR: No obvious PNA  Significant microbiology data: 1/30>> COVID/influenza/RSV PCR: Negative 1/30>> blood culture: No growth 1/31>> urine culture: E. coli  Procedures: None  Consults: None  Subjective: Per nursing staff-very poor oral intake-on my exam-he was sleeping comfortably but awoke-and then went back to sleep.  Mumbles-minimally verbal.  Objective: Vitals: Blood pressure 139/77, pulse 80, temperature (!) 100.6 F (38.1 C), temperature source Axillary, resp. rate 19, height 6' (1.829 m), weight 108.9 kg, SpO2 94%.   Exam: Gen Exam:not in any distress HEENT:atraumatic, normocephalic Chest: B/L clear to auscultation anteriorly CVS:S1S2 regular Abdomen:soft non tender, non distended Extremities:no edema Neurology: Difficult exam-withdraws all 4 extremities to pain. Skin: no rash  Pertinent Labs/Radiology:    Latest Ref Rng & Units 05/22/2023    5:26 AM 05/21/2023    5:36 AM 05/20/2023    4:41 AM  CBC  WBC 4.0 - 10.5 K/uL 11.9  12.2  10.5   Hemoglobin 13.0 - 17.0 g/dL 9.6  9.7  9.9   Hematocrit 39.0 - 52.0 % 31.8  33.1  33.9   Platelets 150 - 400 K/uL 174  160  169     Lab Results  Component Value Date   NA 150 (H) 05/22/2023   K 3.3 (L) 05/22/2023   CL 120 (H) 05/22/2023   CO2 20 (L)  05/22/2023      Assessment/Plan: AKI Hemodynamically mediated in the setting of poor oral intake/dehydration Improving with supportive care  Hypernatremia Secondary to poor oral intake-improving with gentle hydration Unfortunately oral intake continues to poor- will continue to have electrolyte derangements in the future.  Hypokalemia Replete/recheck  Acute metabolic encephalopathy Due to AKI/hypernatremia CT imaging negative Unclear what his baseline is-currently confused-sleepy-suspect not far from his baseline.  Constipation-fecal impaction-stercoral colitis Ensure bowel regimen Per RN-having BMs-has Flexi-Seal in place. Rocephin/Flagyl  Complicated UTI Continue Rocephin Follow cultures Unclear why Foley catheter was placed-will remove over the next several days.  Normocytic anemia No evidence of blood loss Likely related to chronic disease/IV fluid dilution/acute illness Follow CBC  Dementia Expect delirium-maintain delirium precautions Per prior notes-depending on nursing staff for ADLs-SNF resident  Seizure disorder CT imaging/EEG stable Continue Trileptal/Keppra  Severe failure to thrive syndrome Palliative care DNR in place-very poor overall prognosis-appears to have advanced dementia-poor oral intake-likely not a candidate for aggressive care-await palliative care recommendations.  Addendum- Unable to reach POA-Belinda Gill-[phone]-however I was able to reach [place] is aware of tenuous situation-poor overall prognosis-I recommended that he is more than appropriate to be transition to full hospice care.  Family is aware that he will continue to have decompensations resulting in dehydration/hyponatremia/AKI.  Family will start ongoing discussions regarding transition to hospice care-they plan to meet in the patient's room later today.  Class 1  Obesity: Estimated body mass index is 32.55 kg/m as calculated from the following:    Height as of this encounter: 6' (1.829 m).   Weight as of this encounter: 108.9 kg.   Code status:   Code Status: Limited: Do not attempt resuscitation (DNR) -DNR-LIMITED -Do Not Intubate/DNI    DVT Prophylaxis: heparin injection 5,000 Units Start: 05/20/23 0600   Family Communication: None at bedside   Disposition Plan: Status is: Inpatient Remains inpatient appropriate because: Severity of illness   Planned Discharge Destination:Skilled nursing facility   Diet: Diet Order             DIET - DYS 1 Room service appropriate? No; Fluid consistency: Thin  Diet effective now                     Antimicrobial agents: Anti-infectives (From admission, onward)    Start     Dose/Rate Route Frequency Ordered Stop   05/21/23 0800  vancomycin (VANCOREADY) IVPB 750 mg/150 mL  Status:  Discontinued        750 mg 150 mL/hr over 60 Minutes Intravenous Every 24 hours 05/20/23 0623 05/20/23 0625   05/21/23 0800  vancomycin (VANCOCIN) 750 mg in sodium chloride 0.9 % 250 mL IVPB        750 mg 250 mL/hr over 60 Minutes Intravenous Every 24 hours 05/20/23 0625     05/21/23 0600  cefTRIAXone (ROCEPHIN) 1 g in sodium chloride 0.9 % 100 mL IVPB        1 g 200 mL/hr over 30 Minutes Intravenous Every 24 hours 05/20/23 0603     05/20/23 0615  cefTRIAXone (ROCEPHIN) 1 g in sodium chloride 0.9 % 100 mL IVPB        1 g 200 mL/hr over 30 Minutes Intravenous  Once 05/20/23 0607 05/20/23 0903   05/20/23 0615  vancomycin (VANCOREADY) IVPB 2000 mg/400 mL        2,000 mg 200 mL/hr over 120 Minutes Intravenous  Once 05/20/23 0612 05/20/23 0902        MEDICATIONS: Scheduled Meds:  Chlorhexidine Gluconate Cloth  6 each Topical Daily   heparin  5,000 Units Subcutaneous Q8H   metoprolol tartrate  12.5 mg Oral BID   OXcarbazepine  150 mg Oral QHS   Oxcarbazepine  300 mg Oral Daily   Continuous Infusions:  cefTRIAXone (ROCEPHIN)  IV 1 g (05/22/23 0452)   levETIRAcetam 500 mg (05/22/23 0850)    vancomycin 750 mg (05/22/23 0849)   PRN Meds:.acetaminophen **OR** acetaminophen, dextrose, ondansetron **OR** ondansetron (ZOFRAN) IV   I have personally reviewed following labs and imaging studies  LABORATORY DATA: CBC: Recent Labs  Lab 05/19/23 2040 05/20/23 0441 05/21/23 0536 05/22/23 0526  WBC 9.7 10.5 12.2* 11.9*  NEUTROABS 6.6  --   --   --   HGB 9.8* 9.9* 9.7* 9.6*  HCT 33.4* 33.9* 33.1* 31.8*  MCV 95.4 94.2 97.9 90.3  PLT 152 169 160 174    Basic Metabolic Panel: Recent Labs  Lab 05/20/23 0441 05/20/23 1652 05/20/23 2222 05/21/23 0536 05/22/23 0526 05/22/23 0830  NA 155* 155* 154* 150* 149* 150*  K 3.9 3.4* 3.1* 4.3 3.4* 3.3*  CL 122* 121* 123* 121* 123* 120*  CO2 22 24 21* 18* 18* 20*  GLUCOSE 218* 192* 160* 156* 129* 128*  BUN 63* 51* 45* 39* 29* 28*  CREATININE 2.42* 1.76* 1.58* 1.46* 1.23 1.32*  CALCIUM 9.1 9.2 9.1 9.0 9.3 9.5  MG 1.8  --  1.7  --   --   --   PHOS  --  2.5 2.2* 2.3*  --   --     GFR: Estimated Creatinine Clearance: 55.9 mL/min (A) (by C-G formula based on SCr of 1.32 mg/dL (H)).  Liver Function Tests: Recent Labs  Lab 05/19/23 2040 05/20/23 0441 05/20/23 1652 05/20/23 2222 05/21/23 0536  AST 25 27  --   --   --   ALT 18 18  --   --   --   ALKPHOS 316* 349*  --   --   --   BILITOT 0.6 0.6  --   --   --   PROT 5.8* 6.7  --   --   --   ALBUMIN 2.2* 2.4* 2.3* 2.2* 2.1*   No results for input(s): "LIPASE", "AMYLASE" in the last 168 hours. No results for input(s): "AMMONIA" in the last 168 hours.  Coagulation Profile: Recent Labs  Lab 05/19/23 2040  INR 1.4*    Cardiac Enzymes: Recent Labs  Lab 05/20/23 0441  CKTOTAL 866*    BNP (last 3 results) No results for input(s): "PROBNP" in the last 8760 hours.  Lipid Profile: No results for input(s): "CHOL", "HDL", "LDLCALC", "TRIG", "CHOLHDL", "LDLDIRECT" in the last 72 hours.  Thyroid Function Tests: Recent Labs    05/22/23 0535  TSH 1.682    Anemia  Panel: No results for input(s): "VITAMINB12", "FOLATE", "FERRITIN", "TIBC", "IRON", "RETICCTPCT" in the last 72 hours.  Urine analysis:    Component Value Date/Time   COLORURINE AMBER (A) 05/20/2023 0400   APPEARANCEUR HAZY (A) 05/20/2023 0400   LABSPEC 1.017 05/20/2023 0400   PHURINE 5.0 05/20/2023 0400   GLUCOSEU NEGATIVE 05/20/2023 0400   HGBUR LARGE (A) 05/20/2023 0400   BILIRUBINUR NEGATIVE 05/20/2023 0400   KETONESUR NEGATIVE 05/20/2023 0400   PROTEINUR NEGATIVE 05/20/2023 0400   NITRITE NEGATIVE 05/20/2023 0400   LEUKOCYTESUR NEGATIVE 05/20/2023 0400    Sepsis Labs: Lactic Acid, Venous    Component Value Date/Time   LATICACIDVEN 1.5 05/21/2023 0223    MICROBIOLOGY: Recent Results (from the past 240 hours)  Blood Culture (routine x 2)     Status: None (Preliminary result)   Collection Time: 05/19/23  8:40 PM   Specimen: BLOOD  Result Value Ref Range Status   Specimen Description BLOOD RIGHT ANTECUBITAL  Final   Special Requests   Final    BOTTLES DRAWN AEROBIC ONLY Blood Culture results may not be optimal due to an excessive volume of blood received in culture bottles   Culture   Final    NO GROWTH 3 DAYS Performed at [facility], [address], [place], [place] [address]    Report Status PENDING  Incomplete  Resp panel by RT-PCR (RSV, Flu A&B, Covid) Anterior Nasal Swab     Status: None   Collection Time: 05/19/23 10:30 PM   Specimen: Anterior Nasal Swab  Result Value Ref Range Status   SARS Coronavirus 2 by RT PCR NEGATIVE NEGATIVE Final   Influenza A by PCR NEGATIVE NEGATIVE Final   Influenza B by PCR NEGATIVE NEGATIVE Final    Comment: (NOTE) The Xpert Xpress SARS-CoV-2/FLU/RSV plus assay is intended as an aid in the diagnosis of influenza from Nasopharyngeal swab specimens and should not be used as a sole basis for treatment. Nasal washings and aspirates are unacceptable for Xpert Xpress SARS-CoV-2/FLU/RSV testing.  Fact Sheet for  Patients: BloggerCourse.com  Fact Sheet for Healthcare Providers: SeriousBroker.it  This test is  not yet approved or cleared by the [place] and has been authorized for detection and/or diagnosis of SARS-CoV-2 by FDA under an Emergency Use Authorization (EUA). This EUA will remain in effect (meaning this test can be used) for the duration of the COVID-19 declaration under Section 564(b)(1) of the Act, 21 U.S.C. section 360bbb-3(b)(1), unless the authorization is terminated or revoked.     Resp Syncytial Virus by PCR NEGATIVE NEGATIVE Final    Comment: (NOTE) Fact Sheet for Patients: BloggerCourse.com  Fact Sheet for Healthcare Providers: SeriousBroker.it  This test is not yet approved or cleared by the [place] FDA and has been authorized for detection and/or diagnosis of SARS-CoV-2 by FDA under an Emergency Use Authorization (EUA). This EUA will remain in effect (meaning this test can be used) for the duration of the COVID-19 declaration under Section 564(b)(1) of the Act, 21 U.S.C. section 360bbb-3(b)(1), unless the authorization is terminated or revoked.  Performed at [facility], [address] [place], [place] [address]   Urine Culture     Status: Abnormal (Preliminary result)   Collection Time: 05/20/23  4:00 AM   Specimen: Urine, Random  Result Value Ref Range Status   Specimen Description URINE, RANDOM  Final   Special Requests NONE Reflexed from [id number]  Final   Culture (A)  Final    >=100,000 COLONIES/mL ESCHERICHIA COLI SUSCEPTIBILITIES TO FOLLOW Performed at [facility], [address], [place], [place] [address]    Report Status PENDING  Incomplete  C Difficile Quick Screen w PCR reflex     Status: None   Collection Time: 05/21/23  6:23 PM   Specimen: STOOL  Result Value Ref Range Status   C Diff antigen NEGATIVE NEGATIVE Final    C Diff toxin NEGATIVE NEGATIVE Final   C Diff interpretation No C. difficile detected.  Final    Comment: Performed at [facility], [address] [place], [place] [address]    RADIOLOGY STUDIES/RESULTS: DG CHEST PORT 1 VIEW Result Date: 05/22/2023 CLINICAL DATA:  Shortness of breath EXAM: PORTABLE CHEST 1 VIEW COMPARISON:  05/19/2023 FINDINGS: Artifact from EKG leads. Unchanged elevation of the left diaphragm atelectasis by recent abdominal CT. Normal heart size. Extensive artifact from EKG leads. 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 IMPRESSION: Unchanged retrocardiac opacity and volume loss correlating with atelectasis on recent abdominal CT. Electronically Signed   By: Tiburcio Pea M.D.   On: 05/22/2023 07:51   DG Abd 1 View Result Date: 05/21/2023 CLINICAL DATA:  Constipation EXAM: ABDOMEN - 1 VIEW COMPARISON:  None Available. FINDINGS: The bowel gas pattern is normal. Small stool burden. No radio-opaque calculi or other significant radiographic abnormality are seen. IMPRESSION: 1. Negative. Electronically Signed   By: Helyn Numbers M.D.   On: 05/21/2023 21:28   EEG adult Result Date: 05/20/2023 Charlsie Quest, MD     05/20/2023 10:49 AM Patient Name: Sigfredo Schreier MRN: [id number] Epilepsy Attending: Charlsie Quest Referring Physician/Provider: Lurline Del, MD Date: 05/20/2023 Duration: 23.47 mins Patient history: 83yo M with ams getting eeg to evaluate for seizure Level of alertness: Awake AEDs during EEG study: LEV, OXC Technical aspects: This EEG study was done with scalp electrodes positioned according to the 10-20 International system of electrode placement. Electrical activity was reviewed with band pass filter of 1-70Hz , sensitivity of 7 uV/mm, display speed of 94mm/sec with a 60Hz  notched filter applied as appropriate. EEG  data were recorded continuously and digitally stored.  Video monitoring was available and  reviewed as appropriate. Description: EEG showed continuous generalized 3 to 6 Hz theta-delta slowing. Hyperventilation and photic stimulation were not performed.   ABNORMALITY - Continuous slow, generalized IMPRESSION: This study is suggestive of moderate diffuse encephalopathy. No seizures or epileptiform discharges were seen throughout the recording. Priyanka Annabelle Harman     LOS: 2 days   Jeoffrey Massed, MD  [facility]    To contact the attending provider between 7A-7P or the covering provider during after hours 7P-7A, please log into the web site [email or web address] and access using universal [place] password for that web site. If you do not have the password, please call the hospital operator.  05/22/2023, 9:25 AM

## 2023-05-22 NOTE — Progress Notes (Signed)
   05/22/23 0447  Assess: MEWS Score  Temp 100.2 F (37.9 C)  BP (!) 124/54  MAP (mmHg) 72  Pulse Rate 96  ECG Heart Rate (!) 118  Resp (!) 30  Level of Consciousness Alert  SpO2 95 %  O2 Device Room Air  Assess: MEWS Score  MEWS Temp 0  MEWS Systolic 0  MEWS Pulse 2  MEWS RR 2  MEWS LOC 0  MEWS Score 4  MEWS Score Color Red  Assess: if the MEWS score is Yellow or Red  Were vital signs accurate and taken at a resting state? Yes  Does the patient meet 2 or more of the SIRS criteria? No  MEWS guidelines implemented  No, previously yellow, continue vital signs every 4 hours  Notify: Charge Nurse/RN  Name of Charge Nurse/RN Notified [facility]  Provider Notification  Provider Name/Title MD Sundil  Date Provider Notified 05/22/23  Time Provider Notified [phone]  Method of Notification Page (secure chat)  Notification Reason Other (Comment) (RED mews)  Provider response See new orders  Date of Provider Response 05/22/23  Assess: SIRS CRITERIA  SIRS Temperature  0  SIRS Respirations  1  SIRS Pulse 1  SIRS WBC 0  SIRS Score Sum  2

## 2023-05-22 NOTE — Progress Notes (Addendum)
RN reported that patient is tachypneic 34 and tachycardic heart rate up to 120.  Blood pressure borderline soft.  O2 sat 95% room air.  Per chart review patient has been admitted for acute kidney injury, hyponatremia in setting of dehydration and UTI. -Obtaining stat chest x-ray and obtaining an EKG.  Previous EKG showed atrial fibrillation likely in the setting of electrolyte derangement specifically hyponatremia.  - EKG showing accelerated junctional rhythm with frequent premature ventricular complex heart rate 117. Abnormal EKG finding in the setting of persistent hyponatremia.  Will continue cardiac monitoring for now.  Starting low-dose Lopressor 12.5 mg.  Checking TSH level.  Tereasa Coop, MD [facility] 05/22/2023, 5:01 AM

## 2023-05-22 NOTE — Plan of Care (Signed)
  Problem: Health Behavior/Discharge Planning: Goal: Ability to manage health-related needs will improve Outcome: Progressing   Problem: Clinical Measurements: Goal: Will remain free from infection Outcome: Progressing   Problem: Elimination: Goal: Will not experience complications related to bowel motility Outcome: Progressing   Problem: Safety: Goal: Ability to remain free from injury will improve Outcome: Progressing   Problem: Skin Integrity: Goal: Risk for impaired skin integrity will decrease Outcome: Progressing

## 2023-05-22 NOTE — Progress Notes (Signed)
  RN reported that patient is not awake enough to keep the oral Lopressor for tonight.  Patient's blood pressure is elevated 169/77 and heart rate 120. - Changing oral Lopressor 12.5 mg twice daily to IV 10 mg twice daily. - If blood pressure still remain elevated up after giving IV Lopressor 10 mg in that case we will add IV hydralazine as needed.  Tereasa Coop, MD [facility] 05/22/2023, 9:08 PM

## 2023-05-23 DIAGNOSIS — N179 Acute kidney failure, unspecified: Secondary | ICD-10-CM | POA: Diagnosis not present

## 2023-05-23 DIAGNOSIS — E860 Dehydration: Secondary | ICD-10-CM | POA: Diagnosis not present

## 2023-05-23 DIAGNOSIS — K5289 Other specified noninfective gastroenteritis and colitis: Secondary | ICD-10-CM | POA: Diagnosis not present

## 2023-05-23 DIAGNOSIS — F0390 Unspecified dementia without behavioral disturbance: Secondary | ICD-10-CM | POA: Diagnosis not present

## 2023-05-23 DIAGNOSIS — Z515 Encounter for palliative care: Secondary | ICD-10-CM | POA: Diagnosis not present

## 2023-05-23 LAB — BASIC METABOLIC PANEL
Anion gap: 8 (ref 5–15)
Anion gap: 8 (ref 5–15)
BUN: 24 mg/dL — ABNORMAL HIGH (ref 8–23)
BUN: 23 mg/dL (ref 8–23)
CO2: 18 mmol/L — ABNORMAL LOW (ref 22–32)
CO2: 19 mmol/L — ABNORMAL LOW (ref 22–32)
Calcium: 9.2 mg/dL (ref 8.9–10.3)
Calcium: 9.2 mg/dL (ref 8.9–10.3)
Chloride: 121 mmol/L — ABNORMAL HIGH (ref 98–111)
Chloride: 119 mmol/L — ABNORMAL HIGH (ref 98–111)
Creatinine, Ser: 1.15 mg/dL (ref 0.61–1.24)
Creatinine, Ser: 1.11 mg/dL (ref 0.61–1.24)
GFR, Estimated: >60 mL/min (ref >60)
GFR, Estimated: >60 mL/min (ref >60)
Glucose, Bld: 191 mg/dL — ABNORMAL HIGH (ref 70–99)
Glucose, Bld: 199 mg/dL — ABNORMAL HIGH (ref 70–99)
Potassium: 3.3 mmol/L — ABNORMAL LOW (ref 3.5–5.1)
Potassium: 3.1 mmol/L — ABNORMAL LOW (ref 3.5–5.1)
Sodium: 147 mmol/L — ABNORMAL HIGH (ref 135–145)
Sodium: 146 mmol/L — ABNORMAL HIGH (ref 135–145)

## 2023-05-23 LAB — GLUCOSE, CAPILLARY
Glucose-Capillary: 159 mg/dL — ABNORMAL HIGH (ref 70–99)
Glucose-Capillary: 164 mg/dL — ABNORMAL HIGH (ref 70–99)
Glucose-Capillary: 187 mg/dL — ABNORMAL HIGH (ref 70–99)
Glucose-Capillary: 202 mg/dL — ABNORMAL HIGH (ref 70–99)

## 2023-05-23 LAB — PHOSPHORUS: Phosphorus: 2.4 mg/dL — ABNORMAL LOW (ref 2.5–4.6)

## 2023-05-23 LAB — MAGNESIUM: Magnesium: 1.6 mg/dL — ABNORMAL LOW (ref 1.7–2.4)

## 2023-05-23 MED ORDER — MAGNESIUM SULFATE 4 GM/100ML IV SOLN
4.0000 g | Freq: Once | INTRAVENOUS | Status: AC
  Administered 2023-05-23: 4 g via INTRAVENOUS
  Filled 2023-05-23: qty 100

## 2023-05-23 MED ORDER — POTASSIUM CHLORIDE 10 MEQ/100ML IV SOLN
10.0000 meq | INTRAVENOUS | Status: AC
  Administered 2023-05-23 (×4): 10 meq via INTRAVENOUS
  Filled 2023-05-23 (×4): qty 100

## 2023-05-23 MED ORDER — METOPROLOL TARTRATE 5 MG/5ML IV SOLN
5.0000 mg | Freq: Four times a day (QID) | INTRAVENOUS | Status: DC
Start: 2023-05-23 — End: 2023-05-26
  Administered 2023-05-23 – 2023-05-25 (×8): 5 mg via INTRAVENOUS
  Filled 2023-05-23 (×10): qty 5

## 2023-05-23 NOTE — Plan of Care (Signed)
  Problem: Clinical Measurements: Goal: Ability to maintain clinical measurements within normal limits will improve Outcome: Progressing Goal: Will remain free from infection Outcome: Progressing   Problem: Activity: Goal: Risk for activity intolerance will decrease Outcome: Progressing   Problem: Elimination: Goal: Will not experience complications related to bowel motility Outcome: Progressing   Problem: Safety: Goal: Ability to remain free from injury will improve Outcome: Progressing   Problem: Skin Integrity: Goal: Risk for impaired skin integrity will decrease Outcome: Progressing

## 2023-05-23 NOTE — TOC Initial Note (Signed)
Transition of Care [facility]) - Initial/Assessment Note    Patient Details  Name: Christopher Hobbs MRN: [id number] Date of Birth: [birth year]  Transition of Care [facility]) CM/SW Contact:    Mearl Latin, LCSW Phone Number: 05/23/2023, 1:52 PM  Clinical Narrative:                 Patient admitted from [facility] long term care SNF. CSW will continue to follow for needs.  Expected Discharge Plan: Skilled Nursing Facility Barriers to Discharge: Continued Medical Work up   Patient Goals and CMS Choice            Expected Discharge Plan and Services In-house Referral: Clinical Social Work, Hospice / Palliative Care   Post Acute Care Choice: Skilled Nursing Facility Living arrangements for the past 2 months: Skilled Nursing Facility                                      Prior Living Arrangements/Services Living arrangements for the past 2 months: Skilled Nursing Facility Lives with:: Facility Resident Patient language and need for interpreter reviewed:: Yes Do you feel safe going back to the place where you live?: Yes      Need for Family Participation in Patient Care: Yes (Comment) Care giver support system in place?: Yes (comment)   Criminal Activity/Legal Involvement Pertinent to Current Situation/Hospitalization: No - Comment as needed  Activities of Daily Living   ADL Screening (condition at time of admission) Independently performs ADLs?: No Does the patient have a NEW difficulty with bathing/dressing/toileting/self-feeding that is expected to last >3 days?: No (total care patient) Does the patient have a NEW difficulty with getting in/out of bed, walking, or climbing stairs that is expected to last >3 days?: No (total care patient) Does the patient have a NEW difficulty with communication that is expected to last >3 days?: No Is the patient deaf or have difficulty hearing?: No Does the patient have difficulty seeing, even when wearing glasses/contacts?: No Does the  patient have difficulty concentrating, remembering, or making decisions?: Yes (dementia)  Permission Sought/Granted Permission sought to share information with : Facility Medical sales representative, Family Supports Permission granted to share information with : No  Share Information with NAME: Massie Bougie  Permission granted to share info w AGENCY: [facility]  Permission granted to share info w Relationship: Daughter  Permission granted to share info w Contact Information: [phone]  Emotional Assessment Appearance:: Appears stated age Attitude/Demeanor/Rapport: Unable to Assess Affect (typically observed): Unable to Assess Orientation: :  (unable to follow commands) Alcohol / Substance Use: Not Applicable Psych Involvement: No (comment)  Admission diagnosis:  Dehydration [E86.0] AKI (acute kidney injury) (HCC) [N17.9] Acute kidney injury (HCC) [N17.9] 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 [place] [address] Phone: [phone] Fax: [phone]     Social Drivers of Health (SDOH) Social History: SDOH Screenings   Food Insecurity: Patient Unable To Answer (05/20/2023)  Housing: Patient Unable To Answer (05/20/2023)  Transportation Needs: Patient Unable To Answer (05/20/2023)  Utilities: Patient Unable To Answer (05/20/2023)  Social Connections: Unknown (05/20/2023)  Tobacco Use: Low Risk  (05/20/2023)   SDOH Interventions:     Readmission Risk Interventions     No  data to display

## 2023-05-23 NOTE — Plan of Care (Signed)

## 2023-05-23 NOTE — Progress Notes (Signed)
Pt had an order for mattress replacement but it couldn't be available for today

## 2023-05-23 NOTE — Progress Notes (Signed)
PROGRESS NOTE        PATIENT DETAILS Name: Christopher Hobbs Age: 83 y.o. Sex: male Date of Birth: 06-06-1940 Admit Date: 05/19/2023 Admitting Physician Lurline Del, MD QMV:[id number], Jerelyn Scott, MD  Brief Summary: Patient is a 83 y.o.  male with history of dementia, seizures-brought in from SNF for acute metabolic encephalopathy in the setting of AKI/hyponatremia due to failure to thrive syndrome/a UTI.  Significant events: 1/30>> admit to [facility]  Significant studies: 1/30>> CT head: No acute intracranial abnormality 1/30>> CT abdomen/pelvis: Large stool in rectosigmoid-stercoral colitis. 1/31>> EEG: No seizures 2/1>> x-ray abdomen: No SBO. 2/2>> CXR: No obvious PNA  Significant microbiology data: 1/30>> COVID/influenza/RSV PCR: Negative 1/30>> blood culture: No growth 1/31>> urine culture: E. coli  Procedures: None  Consults: None  Subjective: Awake-minimally verbal-poor oral intake-tracks my movement at times.  Sleepy/lethargic however.  Objective: Vitals: Blood pressure (!) 154/67, pulse (!) 56, temperature 98.1 F (36.7 C), temperature source Oral, resp. rate 18, height 6' (1.829 m), weight 108.9 kg, SpO2 100%.   Exam: Gen Exam: Frail-slightly lethargic-not in any distress HEENT:atraumatic, normocephalic Chest: B/L clear to auscultation anteriorly CVS:S1S2 regular Abdomen:soft non tender, non distended Extremities:no edema Neurology: Non focal-difficult exam but seems to be moving all 4 extremities. Skin: no rash  Pertinent Labs/Radiology:    Latest Ref Rng & Units 05/22/2023    5:26 AM 05/21/2023    5:36 AM 05/20/2023    4:41 AM  CBC  WBC 4.0 - 10.5 K/uL 11.9  12.2  10.5   Hemoglobin 13.0 - 17.0 g/dL 9.6  9.7  9.9   Hematocrit 39.0 - 52.0 % 31.8  33.1  33.9   Platelets 150 - 400 K/uL 174  160  169     Lab Results  Component Value Date   NA 146 (H) 05/23/2023   K 3.1 (L) 05/23/2023   CL 119 (H) 05/23/2023   CO2 19 (L) 05/23/2023       Assessment/Plan: AKI Hemodynamically mediated in the setting of poor oral intake/dehydration Improving with supportive care  Hypernatremia Secondary to poor oral intake-improving with gentle hydration Unfortunately oral intake continues to poor-minimal-at significant risk of continued metabolic derangements in the near future  Hypokalemia Replete/recheck  Hypomagnesemia Replete/recheck  Hypophosphatemia Replete/recheck  Acute metabolic encephalopathy Due to AKI/hypernatremia CT imaging negative Reviewed prior notes-appears to have very advanced dementia-suspect he will continue to have delirium/encephalopathy when he is hospitalized.  Constipation-fecal impaction-stercoral colitis Ensure bowel regimen Per RN-having BMs-has Flexi-Seal in place. Rocephin/Flagyl for now  Complicated UTI Continue Rocephin Follow cultures Unclear why Foley catheter was placed-will remove over the next several days.  Normocytic anemia No evidence of blood loss Likely related to chronic disease/IV fluid dilution/acute illness Follow CBC  Dementia Expect delirium-maintain delirium precautions Per prior notes-depending on nursing staff for ADLs-SNF resident  Seizure disorder CT imaging/EEG stable Continue Trileptal/Keppra  Severe failure to thrive syndrome Palliative care DNR in place Poor candidate for any further escalation in care-suspect will not do well regardless if he survives this hospitalization-very high risk that he will probably have recurrent electrolyte derangements-given the fact that he has advanced dementia and severe failure to thrive syndrome.  Best served by transitioning to comfort measures.  Left another voicemail for POA-Belinda Gill-this morning.   Class 1 Obesity: Estimated body mass index is 32.55 kg/m as calculated from the following:   Height  as of this encounter: 6' (1.829 m).   Weight as of this encounter: 108.9 kg.   Code status:   Code Status:  Limited: Do not attempt resuscitation (DNR) -DNR-LIMITED -Do Not Intubate/DNI    DVT Prophylaxis: heparin injection 5,000 Units Start: 05/20/23 0600   Family Communication: POA-Belinda Gill-[phone]-left another voicemail on 2/3.   Disposition Plan: Status is: Inpatient Remains inpatient appropriate because: Severity of illness   Planned Discharge Destination:Skilled nursing facility   Diet: Diet Order             DIET - DYS 1 Room service appropriate? No; Fluid consistency: Thin  Diet effective now                     Antimicrobial agents: Anti-infectives (From admission, onward)    Start     Dose/Rate Route Frequency Ordered Stop   05/22/23 1030  metroNIDAZOLE (FLAGYL) IVPB 500 mg        500 mg 100 mL/hr over 60 Minutes Intravenous Every 12 hours 05/22/23 0938     05/21/23 0800  vancomycin (VANCOREADY) IVPB 750 mg/150 mL  Status:  Discontinued        750 mg 150 mL/hr over 60 Minutes Intravenous Every 24 hours 05/20/23 0623 05/20/23 0625   05/21/23 0800  vancomycin (VANCOCIN) 750 mg in sodium chloride 0.9 % 250 mL IVPB  Status:  Discontinued        750 mg 250 mL/hr over 60 Minutes Intravenous Every 24 hours 05/20/23 0625 05/22/23 0938   05/21/23 0600  cefTRIAXone (ROCEPHIN) 1 g in sodium chloride 0.9 % 100 mL IVPB        1 g 200 mL/hr over 30 Minutes Intravenous Every 24 hours 05/20/23 0603     05/20/23 0615  cefTRIAXone (ROCEPHIN) 1 g in sodium chloride 0.9 % 100 mL IVPB        1 g 200 mL/hr over 30 Minutes Intravenous  Once 05/20/23 0607 05/20/23 0903   05/20/23 0615  vancomycin (VANCOREADY) IVPB 2000 mg/400 mL        2,000 mg 200 mL/hr over 120 Minutes Intravenous  Once 05/20/23 0612 05/20/23 0902        MEDICATIONS: Scheduled Meds:  Chlorhexidine Gluconate Cloth  6 each Topical Daily   heparin  5,000 Units Subcutaneous Q8H   metoprolol tartrate  5 mg Intravenous Q6H   OXcarbazepine  150 mg Oral QHS   Oxcarbazepine  300 mg Oral Daily    Continuous Infusions:  cefTRIAXone (ROCEPHIN)  IV 1 g (05/23/23 0507)   dextrose 75 mL/hr at 05/23/23 0127   levETIRAcetam 500 mg (05/23/23 0858)   magnesium sulfate bolus IVPB 4 g (05/23/23 0806)   metronidazole Stopped (05/23/23 0025)   potassium chloride 10 mEq (05/23/23 0857)   PRN Meds:.acetaminophen **OR** acetaminophen, dextrose, ondansetron **OR** ondansetron (ZOFRAN) IV   I have personally reviewed following labs and imaging studies  LABORATORY DATA: CBC: Recent Labs  Lab 05/19/23 2040 05/20/23 0441 05/21/23 0536 05/22/23 0526  WBC 9.7 10.5 12.2* 11.9*  NEUTROABS 6.6  --   --   --   HGB 9.8* 9.9* 9.7* 9.6*  HCT 33.4* 33.9* 33.1* 31.8*  MCV 95.4 94.2 97.9 90.3  PLT 152 169 160 174    Basic Metabolic Panel: Recent Labs  Lab 05/20/23 0441 05/20/23 1652 05/20/23 2222 05/21/23 0536 05/22/23 0526 05/22/23 1415 05/22/23 1738 05/22/23 2120 05/23/23 0142 05/23/23 0455  NA 155* 155* 154* 150*   < > 148* 149* 147* 147*  146*  K 3.9 3.4* 3.1* 4.3   < > 3.4* 3.4* 3.4* 3.3* 3.1*  CL 122* 121* 123* 121*   < > 120* 121* 122* 121* 119*  CO2 22 24 21* 18*   < > 20* 20* 18* 18* 19*  GLUCOSE 218* 192* 160* 156*   < > 135* 146* 182* 191* 199*  BUN 63* 51* 45* 39*   < > 26* 26* 25* 24* 23  CREATININE 2.42* 1.76* 1.58* 1.46*   < > 1.29* 1.14 1.24 1.15 1.11  CALCIUM 9.1 9.2 9.1 9.0   < > 9.3 9.3 9.3 9.2 9.2  MG 1.8  --  1.7  --   --   --   --   --   --  1.6*  PHOS  --  2.5 2.2* 2.3*  --   --   --   --   --  2.4*   < > = values in this interval not displayed.    GFR: Estimated Creatinine Clearance: 66.5 mL/min (by C-G formula based on SCr of 1.11 mg/dL).  Liver Function Tests: Recent Labs  Lab 05/19/23 2040 05/20/23 0441 05/20/23 1652 05/20/23 2222 05/21/23 0536  AST 25 27  --   --   --   ALT 18 18  --   --   --   ALKPHOS 316* 349*  --   --   --   BILITOT 0.6 0.6  --   --   --   PROT 5.8* 6.7  --   --   --   ALBUMIN 2.2* 2.4* 2.3* 2.2* 2.1*   No results for  input(s): "LIPASE", "AMYLASE" in the last 168 hours. No results for input(s): "AMMONIA" in the last 168 hours.  Coagulation Profile: Recent Labs  Lab 05/19/23 2040  INR 1.4*    Cardiac Enzymes: Recent Labs  Lab 05/20/23 0441  CKTOTAL 866*    BNP (last 3 results) No results for input(s): "PROBNP" in the last 8760 hours.  Lipid Profile: No results for input(s): "CHOL", "HDL", "LDLCALC", "TRIG", "CHOLHDL", "LDLDIRECT" in the last 72 hours.  Thyroid Function Tests: Recent Labs    05/22/23 0535  TSH 1.682    Anemia Panel: No results for input(s): "VITAMINB12", "FOLATE", "FERRITIN", "TIBC", "IRON", "RETICCTPCT" in the last 72 hours.  Urine analysis:    Component Value Date/Time   COLORURINE AMBER (A) 05/20/2023 0400   APPEARANCEUR HAZY (A) 05/20/2023 0400   LABSPEC 1.017 05/20/2023 0400   PHURINE 5.0 05/20/2023 0400   GLUCOSEU NEGATIVE 05/20/2023 0400   HGBUR LARGE (A) 05/20/2023 0400   BILIRUBINUR NEGATIVE 05/20/2023 0400   KETONESUR NEGATIVE 05/20/2023 0400   PROTEINUR NEGATIVE 05/20/2023 0400   NITRITE NEGATIVE 05/20/2023 0400   LEUKOCYTESUR NEGATIVE 05/20/2023 0400    Sepsis Labs: Lactic Acid, Venous    Component Value Date/Time   LATICACIDVEN 1.5 05/21/2023 0223    MICROBIOLOGY: Recent Results (from the past 240 hours)  Blood Culture (routine x 2)     Status: None (Preliminary result)   Collection Time: 05/19/23  8:40 PM   Specimen: BLOOD  Result Value Ref Range Status   Specimen Description BLOOD RIGHT ANTECUBITAL  Final   Special Requests   Final    BOTTLES DRAWN AEROBIC ONLY Blood Culture results may not be optimal due to an excessive volume of blood received in culture bottles   Culture   Final    NO GROWTH 4 DAYS Performed at [facility], [address], [place],  [place] [address]    Report Status PENDING  Incomplete  Resp panel by RT-PCR (RSV, Flu A&B, Covid) Anterior Nasal Swab     Status: None   Collection Time: 05/19/23 10:30 PM    Specimen: Anterior Nasal Swab  Result Value Ref Range Status   SARS Coronavirus 2 by RT PCR NEGATIVE NEGATIVE Final   Influenza A by PCR NEGATIVE NEGATIVE Final   Influenza B by PCR NEGATIVE NEGATIVE Final    Comment: (NOTE) The Xpert Xpress SARS-CoV-2/FLU/RSV plus assay is intended as an aid in the diagnosis of influenza from Nasopharyngeal swab specimens and should not be used as a sole basis for treatment. Nasal washings and aspirates are unacceptable for Xpert Xpress SARS-CoV-2/FLU/RSV testing.  Fact Sheet for Patients: BloggerCourse.com  Fact Sheet for Healthcare Providers: SeriousBroker.it  This test is not yet approved or cleared by the [place] FDA and has been authorized for detection and/or diagnosis of SARS-CoV-2 by FDA under an Emergency Use Authorization (EUA). This EUA will remain in effect (meaning this test can be used) for the duration of the COVID-19 declaration under Section 564(b)(1) of the Act, 21 U.S.C. section 360bbb-3(b)(1), unless the authorization is terminated or revoked.     Resp Syncytial Virus by PCR NEGATIVE NEGATIVE Final    Comment: (NOTE) Fact Sheet for Patients: BloggerCourse.com  Fact Sheet for Healthcare Providers: SeriousBroker.it  This test is not yet approved or cleared by the [place] FDA and has been authorized for detection and/or diagnosis of SARS-CoV-2 by FDA under an Emergency Use Authorization (EUA). This EUA will remain in effect (meaning this test can be used) for the duration of the COVID-19 declaration under Section 564(b)(1) of the Act, 21 U.S.C. section 360bbb-3(b)(1), unless the authorization is terminated or revoked.  Performed at [facility], [address], [place], [place] [address]   Urine Culture     Status: Abnormal   Collection Time: 05/20/23  4:00 AM   Specimen: Urine, Random  Result Value Ref  Range Status   Specimen Description URINE, RANDOM  Final   Special Requests   Final    NONE Reflexed from [id number] Performed at [facility], [address], [place], [place] [address]    Culture >=100,000 COLONIES/mL ESCHERICHIA COLI (A)  Final   Report Status 05/22/2023 FINAL  Final   Organism ID, Bacteria ESCHERICHIA COLI (A)  Final      Susceptibility   Escherichia coli - MIC*    AMPICILLIN 8 SENSITIVE Sensitive     CEFAZOLIN <=4 SENSITIVE Sensitive     CEFEPIME <=0.12 SENSITIVE Sensitive     CEFTRIAXONE <=0.25 SENSITIVE Sensitive     CIPROFLOXACIN <=0.25 SENSITIVE Sensitive     GENTAMICIN <=1 SENSITIVE Sensitive     IMIPENEM <=0.25 SENSITIVE Sensitive     NITROFURANTOIN <=16 SENSITIVE Sensitive     TRIMETH/SULFA <=20 SENSITIVE Sensitive     AMPICILLIN/SULBACTAM <=2 SENSITIVE Sensitive     PIP/TAZO <=4 SENSITIVE Sensitive ug/mL    * >=100,000 COLONIES/mL ESCHERICHIA COLI  Gastrointestinal Panel by PCR , Stool     Status: None   Collection Time: 05/21/23  6:23 PM   Specimen: STOOL  Result Value Ref Range Status   Campylobacter species NOT DETECTED NOT DETECTED Final   Plesimonas shigelloides NOT DETECTED NOT DETECTED Final   Salmonella species NOT DETECTED NOT DETECTED Final   Yersinia enterocolitica NOT DETECTED NOT DETECTED Final   Vibrio species NOT DETECTED NOT DETECTED Final   Vibrio cholerae NOT DETECTED  NOT DETECTED Final   Enteroaggregative E coli (EAEC) NOT DETECTED NOT DETECTED Final   Enteropathogenic E coli (EPEC) NOT DETECTED NOT DETECTED Final   Enterotoxigenic E coli (ETEC) NOT DETECTED NOT DETECTED Final   Shiga like toxin producing E coli (STEC) NOT DETECTED NOT DETECTED Final   Shigella/Enteroinvasive E coli (EIEC) NOT DETECTED NOT DETECTED Final   Cryptosporidium NOT DETECTED NOT DETECTED Final   Cyclospora cayetanensis NOT DETECTED NOT DETECTED Final   Entamoeba histolytica NOT DETECTED NOT DETECTED Final   Giardia lamblia NOT DETECTED NOT DETECTED  Final   Adenovirus F40/41 NOT DETECTED NOT DETECTED Final   Astrovirus NOT DETECTED NOT DETECTED Final   Norovirus GI/GII NOT DETECTED NOT DETECTED Final   Rotavirus A NOT DETECTED NOT DETECTED Final   Sapovirus (I, II, IV, and V) NOT DETECTED NOT DETECTED Final    Comment: Performed at [facility], [address] [place], [place] [address]  C Difficile Quick Screen w PCR reflex     Status: None   Collection Time: 05/21/23  6:23 PM   Specimen: STOOL  Result Value Ref Range Status   C Diff antigen NEGATIVE NEGATIVE Final   C Diff toxin NEGATIVE NEGATIVE Final   C Diff interpretation No C. difficile detected.  Final    Comment: Performed at [facility], [address], [place], [place] [address]    RADIOLOGY STUDIES/RESULTS: DG CHEST PORT 1 VIEW Result Date: 05/22/2023 CLINICAL DATA:  Shortness of breath EXAM: PORTABLE CHEST 1 VIEW COMPARISON:  05/19/2023 FINDINGS: Artifact from EKG leads. Unchanged elevation of the left diaphragm atelectasis by recent abdominal CT. Normal heart size. Extensive artifact from EKG leads. 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 IMPRESSION: Unchanged retrocardiac opacity and volume loss correlating with atelectasis on recent abdominal CT. Electronically Signed   By: Tiburcio Pea M.D.   On: 05/22/2023 07:51   DG Abd 1 View Result Date: 05/21/2023 CLINICAL DATA:  Constipation EXAM: ABDOMEN - 1 VIEW COMPARISON:  None Available. FINDINGS: The bowel gas pattern is normal. Small stool burden. No radio-opaque calculi or other significant radiographic abnormality are seen. IMPRESSION: 1. Negative. Electronically Signed   By: Helyn Numbers M.D.   On: 05/21/2023 21:28     LOS: 3 days   Jeoffrey Massed, MD  [facility]    To contact the attending provider between 7A-7P or the covering provider during after hours 7P-7A, please log into the web site [email or web address] and access using  universal [place] password for that web site. If you do not have the password, please call the hospital operator.  05/23/2023, 9:52 AM

## 2023-05-23 NOTE — Consult Note (Signed)
Consultation Note Date: 05/23/2023   Patient Name: Christopher Hobbs  DOB: [birth year]  MRN: [id number]  Age / Sex: 83 y.o., male  PCP: Georgann Housekeeper, MD Referring Physician: Maretta Bees, MD  Reason for Consultation: Establishing goals of care  HPI/Patient Profile: 83 y.o. male  with past medical history of dementia and seizure presented to ED from facility with AMS and hypotension.  Patient was admitted on 05/19/2023 with severe dehydration leading to AKI, sepsis, hypernatremia/hypokalemia, and acute metabolic encephalopathy.   Clinical Assessment and Goals of Care: I have reviewed medical records including EPIC notes, labs, any available advanced directives, and imaging. Received report from primary RN -no acute concerns.  Per RN patient is minimally interactive, able to mumble to communicate, not eating/drinking.  Went to visit patient at bedside - no family/visitors present.  Primary RN and ENT at bedside providing hygiene care.  Patient was lying in bed - he does not respond to voice/gentle touch. No signs or non-verbal gestures of pain or discomfort noted. No respiratory distress, increased work of breathing, or secretions noted.  Mitts in use.  2:25 PM Attempted to call daughter/Belinda to discuss diagnosis, prognosis, GOC, EOL wishes, disposition, and options - no answer - confidential voicemail left and PMT phone number provided with request to return call.  2:27 PM Attempted to call daughter/Shana to discuss diagnosis, prognosis, GOC, EOL wishes, disposition, and options - no answer - confidential voicemail left and PMT phone number provided with request to return call.  2:50 PM Received notification that Edson Snowball returned PMT call.   Called Beverly - emotional support provided.  She clarifies that Massie Bougie is patient's niece and she is patient's great niece.  Massie Bougie is patient's HCPOA.  Patient has no  children.  Edson Snowball confirms having previous discussions with providers regarding patient's current acute medical situation. Offered family meeting to ensure everyone is on the same page and to discuss goals of care/next steps. Edson Snowball is very appreciative and would like to schedule family meeting for tomorrow 2/4.  She will speak with Belinda and call PMT with preferred time.  Questions and concerns were addressed. The patient/family was encouraged to call with questions and/or concerns. PMT number was provided.   Primary Decision Maker: NEXT OF KIN - niece/Belinda Gill    SUMMARY OF RECOMMENDATIONS   Continue to treat the treatable Continue DNR/DNI as previously documented Family meeting scheduled for tomorrow 2/4 for full GOC - family to call PMT with preferred time PMT will continue to follow and support holistically   Code Status/Advance Care Planning: DNR  Palliative Prophylaxis:  Aspiration, Delirium Protocol, Frequent Pain Assessment, Oral Care, and Turn Reposition  Additional Recommendations (Limitations, Scope, Preferences): Full Scope Treatment  Psycho-social/Spiritual:  Desire for further Chaplaincy support:no Created space and opportunity for patient and family to express thoughts and feelings regarding patient's current medical situation.  Emotional support and therapeutic listening provided.  Prognosis:  Overall poor due to advanced age, advanced dementia, and poor oral intake  Discharge Planning: To Be Determined      Primary Diagnoses: Present on Admission:  AKI (acute kidney injury) (HCC)   I have reviewed the medical record, interviewed the patient and family, and examined the patient. The following aspects are pertinent.  Past Medical History:  Diagnosis Date   Dementia (HCC)    Seizures (HCC)    Social History   Socioeconomic History   Marital status: Divorced    Spouse name: Not on file   Number of children: Not on file  Years of education:  Not on file   Highest education level: Not on file  Occupational History   Not on file  Tobacco Use   Smoking status: Never   Smokeless tobacco: Never  Vaping Use   Vaping status: Never Used  Substance and Sexual Activity   Alcohol use: Not Currently   Drug use: Not Currently   Sexual activity: Not Currently  Other Topics Concern   Not on file  Social History Narrative   Not on file   Social Drivers of Health   Financial Resource Strain: Not on file  Food Insecurity: Patient Unable To Answer (05/20/2023)   Hunger Vital Sign    Worried About Running Out of Food in the Last Year: Patient unable to answer    Ran Out of Food in the Last Year: Patient unable to answer  Transportation Needs: Patient Unable To Answer (05/20/2023)   PRAPARE - Transportation    Lack of Transportation (Medical): Patient unable to answer    Lack of Transportation (Non-Medical): Patient unable to answer  Physical Activity: Not on file  Stress: Not on file  Social Connections: Unknown (05/20/2023)   Social Connection and Isolation Panel [NHANES]    Frequency of Communication with Friends and Family: Patient unable to answer    Frequency of Social Gatherings with Friends and Family: Patient unable to answer    Attends Religious Services: Patient unable to answer    Active Member of Clubs or Organizations: Patient unable to answer    Attends Banker Meetings: Patient unable to answer    Marital Status: Divorced   Family History  Problem Relation Age of Onset   Hypertension Sister    Heart failure Sister    Diabetes Sister    Scheduled Meds:  Chlorhexidine Gluconate Cloth  6 each Topical Daily   heparin  5,000 Units Subcutaneous Q8H   metoprolol tartrate  5 mg Intravenous Q6H   OXcarbazepine  150 mg Oral QHS   Oxcarbazepine  300 mg Oral Daily   Continuous Infusions:  cefTRIAXone (ROCEPHIN)  IV 1 g (05/23/23 0507)   dextrose 50 mL/hr at 05/23/23 1003   levETIRAcetam 500 mg (05/23/23  0858)   metronidazole 500 mg (05/23/23 1020)   PRN Meds:.acetaminophen **OR** acetaminophen, dextrose, ondansetron **OR** ondansetron (ZOFRAN) IV Medications Prior to Admission:  Prior to Admission medications   Medication Sig Start Date End Date Taking? Authorizing Provider  acetaminophen (TYLENOL) 500 MG tablet Take 500 mg by mouth every 6 (six) hours as needed for mild pain (pain score 1-3) or moderate pain (pain score 4-6).   Yes [provider]  Amino Acids-Protein Hydrolys (FEEDING SUPPLEMENT, PRO-STAT 64,) LIQD Take 30 mLs by mouth 2 (two) times daily.   Yes [provider]  chlorhexidine (PERIDEX) 0.12 % solution 15 mLs 2 (two) times daily. 03/21/23  Yes [provider]  Cholecalciferol (VITAMIN D3) 1.25 MG (50000 UT) TABS Take 1.25 mg by mouth once a week. Saturday   Yes [provider]  doxycycline (ADOXA) 100 MG tablet Take 100 mg by mouth 2 (two) times daily.   Yes [provider]  Feeding Supplies MISC Take 1 Container by mouth 3 (three) times daily. Magic Cup, Sugar free (90cc free water in 120cc) Three times a day for weight with meals   Yes [provider]  fluticasone (FLONASE) 50 MCG/ACT nasal spray Place 1 spray into both nostrils 2 (two) times daily. 06/11/22  Yes [provider]  hydrochlorothiazide (HYDRODIURIL) 12.5 MG  tablet Take 12.5 mg by mouth daily. 03/13/23  Yes [provider]  levETIRAcetam (KEPPRA) 500 MG tablet Take 1 tablet (500 mg total) by mouth 2 (two) times daily. 08/16/17 01/03/25 Yes Joy, Shawn C, PA-C  losartan (COZAAR) 25 MG tablet Take 25 mg by mouth daily. Give one tablet by mouth one time a day related to Primary Hypertension. Hold for SBP <100 03/13/23  Yes [provider]  Melatonin 3 MG TABS Take 6 mg by mouth at bedtime.   Yes [provider]  mirtazapine (REMERON) 15 MG tablet Take 15 mg by mouth at bedtime. 05/04/23  Yes [provider]  OXcarbazepine  (TRILEPTAL) 150 MG tablet Take 150 mg by mouth at bedtime. 06/11/22  Yes [provider]  Oxcarbazepine (TRILEPTAL) 300 MG tablet Take 300 mg by mouth daily. Stable mood   Yes [provider]  vitamin B-12 (CYANOCOBALAMIN) 1000 MCG tablet Take 1,000 mcg by mouth daily.   Yes [provider]  furosemide (LASIX) 40 MG tablet Take 40 mg by mouth daily. Patient not taking: Reported on 05/20/2023    [provider]  lisinopril (ZESTRIL) 10 MG tablet Take 10 mg by mouth daily. Patient not taking: Reported on 05/20/2023 06/11/22   [provider]  OLANZapine (ZYPREXA) 2.5 MG tablet Take 2.5 mg by mouth daily. Patient not taking: Reported on 05/20/2023 07/11/22   [provider]   No Known Allergies Review of Systems  Unable to perform ROS: Dementia    Physical Exam Vitals and nursing note reviewed.  Constitutional:      General: He is not in acute distress. Pulmonary:     Effort: No respiratory distress.  Skin:    General: Skin is warm and dry.     Vital Signs: BP 125/67 (BP Location: Right Leg)   Pulse (!) 54   Temp 98.6 F (37 C) (Axillary)   Resp 20   Ht 6' (1.829 m)   Wt 108.9 kg   SpO2 100%   BMI 32.55 kg/m  Pain Scale: Faces   Pain Score: Asleep   SpO2: SpO2: 100 % O2 Device:SpO2: 100 % O2 Flow Rate: .   IO: Intake/output summary:  Intake/Output Summary (Last 24 hours) at 05/23/2023 1413 Last data filed at 05/23/2023 1100 Gross per 24 hour  Intake 1083.4 ml  Output 700 ml  Net 383.4 ml    LBM: Last BM Date : 05/22/23 Baseline Weight: Weight: 108.9 kg Most recent weight: Weight: 108.9 kg     Palliative Assessment/Data: PPS 10%     Time In-Out: 1400-1430/1450-1535 Time Total: 75 minutes  Signed by: Haskel Khan, NP   Please contact Palliative Medicine Team phone at [phone] for questions and concerns.  For individual provider: See Amion  *Portions of this note are a verbal dictation therefore any  spelling and/or grammatical errors are due to the "Dragon Medical One" system interpretation.

## 2023-05-23 NOTE — Progress Notes (Signed)
Speech Language Pathology Treatment: Dysphagia  Patient Details Name: Christopher Hobbs MRN: [id number] DOB: 14-Jun-1940 Today's Date: 05/23/2023 Time: 6578-4696 SLP Time Calculation (min) (ACUTE ONLY): 10 min  Assessment / Plan / Recommendation Clinical Impression  Pt's positioning is suboptimal for PO intake this date with grimacing noted with attempts at repositioning. RN provided oral care during SLP visit. Observed pt with thin liquids via straw without overt s/s of aspiration. Attempted presentations of further liquids and purees with pt declining and pursing lips. Recommend continuing current diet for now. Reposition pt as fully upright as possible and encourage intake by providing step-by-step instructions ("open your mouth", "take a bite", "sip from the straw"). SLP will continue following to trial advanced textures and provide ongoing education with family regarding impact of dementia on swallowing.    HPI HPI: Pt is an 83 y.o. male who presented to ED with change in mental status as well as hypotension from facility. Per notes facility noted decline x 1 week. CT head (05/19/23) revealed "No evidence of acute intracranial abnormality. 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 EEG (05/20/23) "suggestive of moderate diffuse encephalopathy. No seizures or epileptiform discharges were seen...". Failed [facility] swallow screen 05/20/23. PMH: dementia, seizure      SLP Plan  Continue with current plan of care      Recommendations for follow up therapy are one component of a multi-disciplinary discharge planning process, led by the attending physician.  Recommendations may be updated based on patient status, additional functional criteria and insurance authorization.    Recommendations  Diet recommendations: Dysphagia 1 (puree);Thin liquid Liquids provided via: Cup;Straw Medication Administration: Crushed with puree Supervision: Staff to assist with self  feeding;Full supervision/cueing for compensatory strategies Compensations: Minimize environmental distractions;Slow rate;Small sips/bites;Follow solids with liquid Postural Changes and/or Swallow Maneuvers: Seated upright 90 degrees                  Oral care BID   Frequent or constant Supervision/Assistance Dysphagia, unspecified (R13.10)     Continue with current plan of care     Gwynneth Aliment, M.A., CF-SLP Speech Language Pathology, Acute Rehabilitation Services  Secure Chat preferred [phone]   05/23/2023, 10:33 AM

## 2023-05-24 DIAGNOSIS — N179 Acute kidney failure, unspecified: Secondary | ICD-10-CM | POA: Diagnosis not present

## 2023-05-24 DIAGNOSIS — Z515 Encounter for palliative care: Secondary | ICD-10-CM | POA: Diagnosis not present

## 2023-05-24 DIAGNOSIS — E860 Dehydration: Secondary | ICD-10-CM | POA: Diagnosis not present

## 2023-05-24 DIAGNOSIS — K5289 Other specified noninfective gastroenteritis and colitis: Secondary | ICD-10-CM | POA: Diagnosis not present

## 2023-05-24 DIAGNOSIS — F0390 Unspecified dementia without behavioral disturbance: Secondary | ICD-10-CM | POA: Diagnosis not present

## 2023-05-24 LAB — MAGNESIUM: Magnesium: 1.9 mg/dL (ref 1.7–2.4)

## 2023-05-24 LAB — BASIC METABOLIC PANEL
Anion gap: 7 (ref 5–15)
BUN: 17 mg/dL (ref 8–23)
CO2: 18 mmol/L — ABNORMAL LOW (ref 22–32)
Calcium: 9 mg/dL (ref 8.9–10.3)
Chloride: 119 mmol/L — ABNORMAL HIGH (ref 98–111)
Creatinine, Ser: 1.17 mg/dL (ref 0.61–1.24)
GFR, Estimated: >60 mL/min (ref >60)
Glucose, Bld: 188 mg/dL — ABNORMAL HIGH (ref 70–99)
Potassium: 3.4 mmol/L — ABNORMAL LOW (ref 3.5–5.1)
Sodium: 144 mmol/L (ref 135–145)

## 2023-05-24 LAB — PHOSPHORUS: Phosphorus: 2.5 mg/dL (ref 2.5–4.6)

## 2023-05-24 LAB — GLUCOSE, CAPILLARY
Glucose-Capillary: 155 mg/dL — ABNORMAL HIGH (ref 70–99)
Glucose-Capillary: 192 mg/dL — ABNORMAL HIGH (ref 70–99)

## 2023-05-24 LAB — CULTURE, BLOOD (ROUTINE X 2): Culture: NO GROWTH

## 2023-05-24 MED ORDER — POTASSIUM PHOSPHATES 15 MMOLE/5ML IV SOLN
30.0000 mmol | Freq: Once | INTRAVENOUS | Status: AC
  Administered 2023-05-24: 30 mmol via INTRAVENOUS
  Filled 2023-05-24: qty 10

## 2023-05-24 MED ORDER — POTASSIUM PHOSPHATES 15 MMOLE/5ML IV SOLN
30.0000 mmol | Freq: Once | INTRAVENOUS | Status: DC
  Filled 2023-05-24: qty 10

## 2023-05-24 MED ORDER — POTASSIUM CHLORIDE 10 MEQ/100ML IV SOLN: 10.0000 meq | INTRAVENOUS | Status: DC

## 2023-05-24 MED ORDER — POTASSIUM CHLORIDE 10 MEQ/100ML IV SOLN: 10.0000 meq | INTRAVENOUS | Status: AC

## 2023-05-24 MED ORDER — POTASSIUM CHLORIDE 10 MEQ/100ML IV SOLN
10.0000 meq | INTRAVENOUS | Status: AC
  Administered 2023-05-24 (×2): 10 meq via INTRAVENOUS
  Filled 2023-05-24 (×2): qty 100

## 2023-05-24 NOTE — Progress Notes (Addendum)
 Daily Progress Note   Patient Name: Christopher Hobbs       Date: 05/24/2023 DOB: [birth year]  Age: 83 y.o. MRN#: [id number] Attending Physician: Christopher Donalda HERO, MD Primary Care Physician: Christopher Other, MD Admit Date: 05/19/2023  Reason for Consultation/Follow-up: Establishing goals of care  Subjective: I have reviewed medical records including EPIC notes, MAR, any available advanced directives as necessary, and labs. Received report from primary RN - no acute concerns. RN reports patient did not eat or drink yesterday and did not take oral medications last night. Per RN, patient remains confused.  Went to visit patient at bedside - no family/visitors present. Patient was lying in bed asleep - I did not attempt to wake him. No signs or non-verbal gestures of pain or discomfort noted. No respiratory distress, increased work of breathing, or secretions noted. Mitts in use.  9:30 AM Attempted to call niece/Christopher Hobbs to discuss diagnosis, prognosis, GOC, EOL wishes, disposition, and options - no answer - confidential voicemail left and PMT phone number provided with request to return call.   9:31 AM Called niece/Christopher Hobbs - emotional support provided. She has been trying to schedule a meeting to include patient's primary HCPOA/Christopher Hobbs but has not been able to solidify a time with her. Therapeutic listening provided as she reflects on watching him (patient's) situation at [facility] and wanting to do what's right for him - I know he's not eating enough. She understands that his overall condition is not reversible. Validated her thoughts and that medical team shares the same concerns. We briefly reviewed recommendation for hospice care at this time and she agrees this would be a reasonable next step. She  confirms HCPOA hierarchy to be: 1. Christopher Hobbs, 2. Christopher Hobbs, 3. Her husband/Christopher Hobbs. Christopher Hobbs shares that not all family share the same goals and is hopeful they will make decisions to not put patient through unnecessary things. She will continue to try and get meeting scheduled vs have Christopher Hobbs call PMT to defer decisions to her Christopher Hobbs) if Christopher is not willing/wanting to make decisions for patient.    All questions and concerns addressed. Encouraged to call with questions and/or concerns. PMT number previously provided.  Length of Stay: 4  Current Medications: Scheduled Meds:   Chlorhexidine  Gluconate Cloth  6 each Topical Daily   heparin   5,000 Units Subcutaneous  Q8H   metoprolol  tartrate  5 mg Intravenous Q6H   OXcarbazepine   150 mg Oral QHS   Oxcarbazepine   300 mg Oral Daily    Continuous Infusions:  cefTRIAXone  (ROCEPHIN )  IV 1 g (05/24/23 0513)   dextrose  50 mL/hr at 05/23/23 2051   levETIRAcetam  Stopped (05/23/23 2130)   metronidazole  Stopped (05/23/23 2200)   potassium chloride      potassium PHOSPHATE  IVPB (in mmol)      PRN Meds: acetaminophen  **OR** acetaminophen , dextrose , ondansetron  **OR** ondansetron  (ZOFRAN ) IV  Physical Exam Vitals and nursing note reviewed.  Constitutional:      General: He is not in acute distress.    Appearance: He is ill-appearing.  Pulmonary:     Effort: No respiratory distress.  Skin:    General: Skin is warm and dry.  Neurological:     Motor: Weakness present.             Vital Signs: BP (!) 130/54 (BP Location: Right Leg)   Pulse (!) 55   Temp 99.5 F (37.5 C) (Axillary)   Resp 20   Ht 6' (1.829 m)   Wt 108.9 kg   SpO2 99%   BMI 32.55 kg/m  SpO2: SpO2: 99 % O2 Device: O2 Device: Room Air O2 Flow Rate:    Intake/output summary:  Intake/Output Summary (Last 24 hours) at 05/24/2023 0931 Last data filed at 05/24/2023 0415 Gross per 24 hour  Intake 300 ml  Output 350 ml  Net -50 ml   LBM: Last BM Date :  05/23/23 Baseline Weight: Weight: 108.9 kg Most recent weight: Weight: 108.9 kg       Palliative Assessment/Data: PPS 10-20%      Patient Active Problem List   Diagnosis Date Noted   AKI (acute kidney injury) (HCC) 05/20/2023    Palliative Care Assessment & Plan   Patient Profile: 83 y.o. male  with past medical history of dementia and seizure presented to ED from facility with AMS and hypotension.  Patient was admitted on 05/19/2023 with severe dehydration leading to AKI, sepsis, hypernatremia/hypokalemia, and acute metabolic encephalopathy.   Assessment: Principal Problem:   AKI (acute kidney injury) (HCC)   Concern about end of life  Recommendations/Plan: Continue current plan of care Continue DNR/DNI as previously documented - durable DNR form completed and placed in shadow chart. Copy was made and will be scanned into Vynca/ACP tab Still working on scheduling family meeting to include primary HCPOA/Christopher Hobbs, if possible. Secondary HCPOA/Christopher Hobbs agrees with comfort/hospice care PMT will continue to follow and support holistically   Goals of Care and Additional Recommendations: Limitations on Scope of Treatment: Full Scope Treatment  Code Status:    Code Status Orders  (From admission, onward)           Start     Ordered   05/20/23 0605  Do not attempt resuscitation (DNR)- Limited -Do Not Intubate (DNI)  (Code Status)  Continuous       Question Answer Comment  If pulseless and not breathing No CPR or chest compressions.   In Pre-Arrest Conditions (Patient Is Breathing and Has A Pulse) Do not intubate. Provide all appropriate non-invasive medical interventions. Avoid ICU transfer unless indicated or required.   Consent: Discussion documented in EHR or advanced directives reviewed      05/20/23 0604           Code Status History     Date Active Date Inactive Code Status Order ID Comments User Context   05/20/2023 [id number] 05/20/2023  9395 Full Code [id number]   Christopher Camila LABOR, MD ED      Advance Directive Documentation    Flowsheet Row Most Recent Value  Type of Advance Directive Healthcare Power of Attorney, Living will  Pre-existing out of facility DNR order (yellow form or pink MOST form) --  MOST Form in Place? --       Prognosis:  Poor if continues with minimal oral intake  Discharge Planning: To Be Determined  Care plan was discussed with primary RN, Dr. Raenelle, patient's niece/Christopher Hobbs  Thank you for allowing the Palliative Medicine Team to assist in the care of this patient.   Total Time 50 minutes Prolonged Time Billed  no       [name], NP  Please contact Palliative Medicine Team phone at [phone] for questions and concerns.   *Portions of this note are a verbal dictation therefore any spelling and/or grammatical errors are due to the Dragon Medical One system interpretation.

## 2023-05-24 NOTE — Progress Notes (Signed)
Pt was grinding on his teeth, when a tooth came dislodged and in pt's mouth. Pt's mouth was suction by RN staff, and pt opened mouth, and tooth was removed and given to his family at bedside. Pt in NAD at this time.

## 2023-05-24 NOTE — Plan of Care (Signed)
  Problem: Clinical Measurements: Goal: Will remain free from infection Outcome: Progressing   Problem: Activity: Goal: Risk for activity intolerance will decrease Outcome: Progressing   Problem: Elimination: Goal: Will not experience complications related to bowel motility Outcome: Progressing   Problem: Skin Integrity: Goal: Risk for impaired skin integrity will decrease Outcome: Progressing

## 2023-05-24 NOTE — Progress Notes (Addendum)
 PROGRESS NOTE        PATIENT DETAILS Name: Christopher Hobbs Age: 83 y.o. Sex: male Date of Birth: Aug 07, 1940 Admit Date: 05/19/2023 Admitting Physician Camila DELENA Ned, MD ERE:Yldjpw, Ardell, MD  Brief Summary: Patient is a 83 y.o.  male with history of dementia, seizures-brought in from SNF for acute metabolic encephalopathy in the setting of AKI/hyponatremia due to failure to thrive syndrome/a UTI.  Significant events: 1/30>> admit to [facility]  Significant studies: 1/30>> CT head: No acute intracranial abnormality 1/30>> CT abdomen/pelvis: Large stool in rectosigmoid-stercoral colitis. 1/31>> EEG: No seizures 2/1>> x-ray abdomen: No SBO. 2/2>> CXR: No obvious PNA  Significant microbiology data: 1/30>> COVID/influenza/RSV PCR: Negative 1/30>> blood culture: No growth 1/31>> urine culture: E. coli (pansensitive)  Procedures: None  Consults: None  Subjective: A bit more awake but still pretty lethargic.  Poor oral intake continues.  Objective: Vitals: Blood pressure (!) 130/54, pulse (!) 55, temperature 99.5 F (37.5 C), temperature source Axillary, resp. rate 20, height 6' (1.829 m), weight 108.9 kg, SpO2 99%.   Exam: Gen Exam: Frail-awake-not in any distress HEENT:atraumatic, normocephalic Chest: B/L clear to auscultation anteriorly CVS:S1S2 regular Abdomen:soft non tender, non distended Extremities:no edema Neurology: Generalized weakness with withdraws all 4 extremities to pain-difficult exam Skin: no rash  Pertinent Labs/Radiology:    Latest Ref Rng & Units 05/22/2023    5:26 AM 05/21/2023    5:36 AM 05/20/2023    4:41 AM  CBC  WBC 4.0 - 10.5 K/uL 11.9  12.2  10.5   Hemoglobin 13.0 - 17.0 g/dL 9.6  9.7  9.9   Hematocrit 39.0 - 52.0 % 31.8  33.1  33.9   Platelets 150 - 400 K/uL 174  160  169     Lab Results  Component Value Date   NA 144 05/24/2023   K 3.4 (L) 05/24/2023   CL 119 (H) 05/24/2023   CO2 18 (L) 05/24/2023       Assessment/Plan: AKI Hemodynamically mediated in the setting of poor oral intake/dehydration Resolved with IVF/supportive care-unfortunately continues to have poor oral intake.  Stop IVF and see how he does.  Hypernatremia Secondary to poor oral intake-resolved with supportive care including IVF Unfortunately continues to have poor oral intake-stop IVF today and see how he does.    Hypokalemia Replete/recheck  Hypomagnesemia Repleted  Hypophosphatemia Repleted  Acute metabolic encephalopathy Due to AKI/hypernatremia CT imaging negative Reviewed prior notes-appears to have very advanced dementia-suspect he will continue to have delirium/encephalopathy when he is hospitalized.  Constipation-fecal impaction-stercoral colitis Ensure bowel regimen Per RN-having BMs-has Flexi-Seal in place. Rocephin /Flagyl  discontinued on 2/4  Complicated UTI Completed Rocephin  x 5 days Unclear why Foley catheter was placed-will await goals of care discussion with palliative care team before contemplating a voiding trial.    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  Best served by transitioning to comfort measures.  Palliative care meeting with family later today.  Class 1 Obesity: Estimated body mass index is 32.55 kg/m as calculated from the following:   Height as of this  encounter: 6' (1.829 m).   Weight as of this encounter: 108.9 kg.   Code status:   Code Status: Limited: Do not attempt resuscitation (DNR) -DNR-LIMITED -Do Not Intubate/DNI    DVT Prophylaxis: heparin  injection 5,000 Units Start: 05/20/23 0600   Family Communication: POA-Belinda Gill-[phone]-left another voicemail on 2/3.   Disposition Plan: Status is: Inpatient Remains inpatient appropriate because: Severity of illness   Planned Discharge Destination:Skilled nursing facility   Diet: Diet Order             DIET - DYS 1 Room service appropriate? No; Fluid consistency: Thin  Diet effective now                     Antimicrobial agents: Anti-infectives (From admission, onward)    Start     Dose/Rate Route Frequency Ordered Stop   05/22/23 1030  metroNIDAZOLE  (FLAGYL ) IVPB 500 mg        500 mg 100 mL/hr over 60 Minutes Intravenous Every 12 hours 05/22/23 0938     05/21/23 0800  vancomycin  (VANCOREADY) IVPB 750 mg/150 mL  Status:  Discontinued        750 mg 150 mL/hr over 60 Minutes Intravenous Every 24 hours 05/20/23 0623 05/20/23 0625   05/21/23 0800  vancomycin  (VANCOCIN ) 750 mg in sodium chloride  0.9 % 250 mL IVPB  Status:  Discontinued        750 mg 250 mL/hr over 60 Minutes Intravenous Every 24 hours 05/20/23 0625 05/22/23 0938   05/21/23 0600  cefTRIAXone  (ROCEPHIN ) 1 g in sodium chloride  0.9 % 100 mL IVPB        1 g 200 mL/hr over 30 Minutes Intravenous Every 24 hours 05/20/23 0603     05/20/23 0615  cefTRIAXone  (ROCEPHIN ) 1 g in sodium chloride  0.9 % 100 mL IVPB        1 g 200 mL/hr over 30 Minutes Intravenous  Once 05/20/23 0607 05/20/23 0903   05/20/23 0615  vancomycin  (VANCOREADY) IVPB 2000 mg/400 mL        2,000 mg 200 mL/hr over 120 Minutes Intravenous  Once 05/20/23 0612 05/20/23 0902        MEDICATIONS: Scheduled Meds:  Chlorhexidine  Gluconate Cloth  6 each Topical Daily   heparin   5,000 Units Subcutaneous Q8H   metoprolol  tartrate  5 mg  Intravenous Q6H   OXcarbazepine   150 mg Oral QHS   Oxcarbazepine   300 mg Oral Daily   Continuous Infusions:  cefTRIAXone  (ROCEPHIN )  IV 1 g (05/24/23 0513)   dextrose  50 mL/hr at 05/23/23 2051   levETIRAcetam  Stopped (05/23/23 2130)   metronidazole  Stopped (05/23/23 2200)   potassium PHOSPHATE  IVPB (in mmol)     PRN Meds:.acetaminophen  **OR** acetaminophen , dextrose , ondansetron  **OR** ondansetron  (ZOFRAN ) IV   I have personally reviewed following labs and imaging studies  LABORATORY DATA: CBC: Recent Labs  Lab 05/19/23 2040 05/20/23 0441 05/21/23 0536 05/22/23 0526  WBC 9.7 10.5 12.2* 11.9*  NEUTROABS 6.6  --   --   --   HGB 9.8* 9.9* 9.7* 9.6*  HCT 33.4* 33.9* 33.1* 31.8*  MCV 95.4 94.2 97.9 90.3  PLT 152 169 160 174    Basic Metabolic Panel: Recent Labs  Lab 05/20/23 0441 05/20/23 1652 05/20/23 2222 05/21/23 0536 05/22/23 0526 05/22/23 1738 05/22/23 2120 05/23/23 0142 05/23/23 0455  05/24/23 0452  NA 155* 155* 154* 150*   < > 149* 147* 147* 146* 144  K 3.9 3.4* 3.1* 4.3   < > 3.4* 3.4* 3.3* 3.1* 3.4*  CL 122* 121* 123* 121*   < > 121* 122* 121* 119* 119*  CO2 22 24 21* 18*   < > 20* 18* 18* 19* 18*  GLUCOSE 218* 192* 160* 156*   < > 146* 182* 191* 199* 188*  BUN 63* 51* 45* 39*   < > 26* 25* 24* 23 17  CREATININE 2.42* 1.76* 1.58* 1.46*   < > 1.14 1.24 1.15 1.11 1.17  CALCIUM 9.1 9.2 9.1 9.0   < > 9.3 9.3 9.2 9.2 9.0  MG 1.8  --  1.7  --   --   --   --   --  1.6* 1.9  PHOS  --  2.5 2.2* 2.3*  --   --   --   --  2.4* 2.5   < > = values in this interval not displayed.    GFR: Estimated Creatinine Clearance: 63.1 mL/min (by C-G formula based on SCr of 1.17 mg/dL).  Liver Function Tests: Recent Labs  Lab 05/19/23 2040 05/20/23 0441 05/20/23 1652 05/20/23 2222 05/21/23 0536  AST 25 27  --   --   --   ALT 18 18  --   --   --   ALKPHOS 316* 349*  --   --   --   BILITOT 0.6 0.6  --   --   --   PROT 5.8* 6.7  --   --   --   ALBUMIN 2.2* 2.4* 2.3*  2.2* 2.1*   No results for input(s): LIPASE, AMYLASE in the last 168 hours. No results for input(s): AMMONIA in the last 168 hours.  Coagulation Profile: Recent Labs  Lab 05/19/23 2040  INR 1.4*    Cardiac Enzymes: Recent Labs  Lab 05/20/23 0441  CKTOTAL 866*    BNP (last 3 results) No results for input(s): PROBNP in the last 8760 hours.  Lipid Profile: No results for input(s): CHOL, HDL, LDLCALC, TRIG, CHOLHDL, LDLDIRECT in the last 72 hours.  Thyroid Function Tests: Recent Labs    05/22/23 0535  TSH 1.682    Anemia Panel: No results for input(s): VITAMINB12, FOLATE, FERRITIN, TIBC, IRON, RETICCTPCT in the last 72 hours.  Urine analysis:    Component Value Date/Time   COLORURINE AMBER (A) 05/20/2023 0400   APPEARANCEUR HAZY (A) 05/20/2023 0400   LABSPEC 1.017 05/20/2023 0400   PHURINE 5.0 05/20/2023 0400   GLUCOSEU NEGATIVE 05/20/2023 0400   HGBUR LARGE (A) 05/20/2023 0400   BILIRUBINUR NEGATIVE 05/20/2023 0400   KETONESUR NEGATIVE 05/20/2023 0400   PROTEINUR NEGATIVE 05/20/2023 0400   NITRITE NEGATIVE 05/20/2023 0400   LEUKOCYTESUR NEGATIVE 05/20/2023 0400    Sepsis Labs: Lactic Acid, Venous    Component Value Date/Time   LATICACIDVEN 1.5 05/21/2023 0223    MICROBIOLOGY: Recent Results (from the past 240 hours)  Blood Culture (routine x 2)     Status: None   Collection Time: 05/19/23  8:40 PM   Specimen: BLOOD  Result Value Ref Range Status   Specimen Description BLOOD RIGHT ANTECUBITAL  Final   Special Requests   Final    BOTTLES DRAWN AEROBIC ONLY Blood Culture results may not be optimal due to an excessive volume of blood received in culture bottles   Culture   Final    NO GROWTH 5 DAYS Performed  at [facility], [address], [place], [place] [address]    Report Status 05/24/2023 FINAL  Final  Resp panel by RT-PCR (RSV, Flu A&B, Covid) Anterior Nasal Swab     Status: None   Collection Time: 05/19/23  10:30 PM   Specimen: Anterior Nasal Swab  Result Value Ref Range Status   SARS Coronavirus 2 by RT PCR NEGATIVE NEGATIVE Final   Influenza A by PCR NEGATIVE NEGATIVE Final   Influenza B by PCR NEGATIVE NEGATIVE Final    Comment: (NOTE) The Xpert Xpress SARS-CoV-2/FLU/RSV plus assay is intended as an aid in the diagnosis of influenza from Nasopharyngeal swab specimens and should not be used as a sole basis for treatment. Nasal washings and aspirates are unacceptable for Xpert Xpress SARS-CoV-2/FLU/RSV testing.  Fact Sheet for Patients: bloggercourse.com  Fact Sheet for Healthcare Providers: seriousbroker.it  This test is not yet approved or cleared by the [place]  FDA and has been authorized for detection and/or diagnosis of SARS-CoV-2 by FDA under an Emergency Use Authorization (EUA). This EUA will remain in effect (meaning this test can be used) for the duration of the COVID-19 declaration under Section 564(b)(1) of the Act, 21 U.S.C. section 360bbb-3(b)(1), unless the authorization is terminated or revoked.     Resp Syncytial Virus by PCR NEGATIVE NEGATIVE Final    Comment: (NOTE) Fact Sheet for Patients: bloggercourse.com  Fact Sheet for Healthcare Providers: seriousbroker.it  This test is not yet approved or cleared by the [place]  FDA and has been authorized for detection and/or diagnosis of SARS-CoV-2 by FDA under an Emergency Use Authorization (EUA). This EUA will remain in effect (meaning this test can be used) for the duration of the COVID-19 declaration under Section 564(b)(1) of the Act, 21 U.S.C. section 360bbb-3(b)(1), unless the authorization is terminated or revoked.  Performed at [facility], [address] [place], [place] [address]   Urine Culture     Status: Abnormal   Collection Time: 05/20/23  4:00 AM   Specimen: Urine, Random   Result Value Ref Range Status   Specimen Description URINE, RANDOM  Final   Special Requests   Final    NONE Reflexed from [id number] Performed at [facility], [address], [place], [place] [address]    Culture >=100,000 COLONIES/mL ESCHERICHIA COLI (A)  Final   Report Status 05/22/2023 FINAL  Final   Organism ID, Bacteria ESCHERICHIA COLI (A)  Final      Susceptibility   Escherichia coli - MIC*    AMPICILLIN 8 SENSITIVE Sensitive     CEFAZOLIN <=4 SENSITIVE Sensitive     CEFEPIME <=0.12 SENSITIVE Sensitive     CEFTRIAXONE  <=0.25 SENSITIVE Sensitive     CIPROFLOXACIN <=0.25 SENSITIVE Sensitive     GENTAMICIN <=1 SENSITIVE Sensitive     IMIPENEM <=0.25 SENSITIVE Sensitive     NITROFURANTOIN <=16 SENSITIVE Sensitive     TRIMETH/SULFA <=20 SENSITIVE Sensitive     AMPICILLIN/SULBACTAM <=2 SENSITIVE Sensitive     PIP/TAZO <=4 SENSITIVE Sensitive ug/mL    * >=100,000 COLONIES/mL ESCHERICHIA COLI  Gastrointestinal Panel by PCR , Stool     Status: None   Collection Time: 05/21/23  6:23 PM   Specimen: STOOL  Result Value Ref Range Status   Campylobacter species NOT DETECTED NOT DETECTED Final   Plesimonas shigelloides NOT DETECTED NOT DETECTED Final   Salmonella species NOT DETECTED NOT DETECTED Final   Yersinia enterocolitica NOT DETECTED NOT DETECTED Final   Vibrio species  NOT DETECTED NOT DETECTED Final   Vibrio cholerae NOT DETECTED NOT DETECTED Final   Enteroaggregative E coli (EAEC) NOT DETECTED NOT DETECTED Final   Enteropathogenic E coli (EPEC) NOT DETECTED NOT DETECTED Final   Enterotoxigenic E coli (ETEC) NOT DETECTED NOT DETECTED Final   Shiga like toxin producing E coli (STEC) NOT DETECTED NOT DETECTED Final   Shigella/Enteroinvasive E coli (EIEC) NOT DETECTED NOT DETECTED Final   Cryptosporidium NOT DETECTED NOT DETECTED Final   Cyclospora cayetanensis NOT DETECTED NOT DETECTED Final   Entamoeba histolytica NOT DETECTED NOT DETECTED Final   Giardia lamblia NOT  DETECTED NOT DETECTED Final   Adenovirus F40/41 NOT DETECTED NOT DETECTED Final   Astrovirus NOT DETECTED NOT DETECTED Final   Norovirus GI/GII NOT DETECTED NOT DETECTED Final   Rotavirus A NOT DETECTED NOT DETECTED Final   Sapovirus (I, II, IV, and V) NOT DETECTED NOT DETECTED Final    Comment: Performed at [facility], [address] [place], [place] [address]  C Difficile Quick Screen w PCR reflex     Status: None   Collection Time: 05/21/23  6:23 PM   Specimen: STOOL  Result Value Ref Range Status   C Diff antigen NEGATIVE NEGATIVE Final   C Diff toxin NEGATIVE NEGATIVE Final   C Diff interpretation No C. difficile detected.  Final    Comment: Performed at [facility], [address], [place], [place] [address]    RADIOLOGY STUDIES/RESULTS: No results found.    LOS: 4 days   Donalda Applebaum, MD  [facility]    To contact the attending provider between 7A-7P or the covering provider during after hours 7P-7A, please log into the web site [email or web address] and access using universal [place] password for that web site. If you do not have the password, please call the hospital operator.  05/24/2023, 11:14 AM

## 2023-05-25 ENCOUNTER — Encounter (HOSPITAL_COMMUNITY): Payer: Self-pay

## 2023-05-25 DIAGNOSIS — Z66 Do not resuscitate: Secondary | ICD-10-CM

## 2023-05-25 DIAGNOSIS — E860 Dehydration: Secondary | ICD-10-CM | POA: Diagnosis not present

## 2023-05-25 DIAGNOSIS — N179 Acute kidney failure, unspecified: Secondary | ICD-10-CM | POA: Diagnosis not present

## 2023-05-25 DIAGNOSIS — R638 Other symptoms and signs concerning food and fluid intake: Secondary | ICD-10-CM

## 2023-05-25 DIAGNOSIS — Z7189 Other specified counseling: Secondary | ICD-10-CM

## 2023-05-25 DIAGNOSIS — Z515 Encounter for palliative care: Secondary | ICD-10-CM

## 2023-05-25 DIAGNOSIS — Z711 Person with feared health complaint in whom no diagnosis is made: Secondary | ICD-10-CM

## 2023-05-25 DIAGNOSIS — Z789 Other specified health status: Secondary | ICD-10-CM

## 2023-05-25 DIAGNOSIS — K5289 Other specified noninfective gastroenteritis and colitis: Secondary | ICD-10-CM | POA: Diagnosis not present

## 2023-05-25 LAB — BASIC METABOLIC PANEL
Anion gap: 8 (ref 5–15)
BUN: 12 mg/dL (ref 8–23)
CO2: 19 mmol/L — ABNORMAL LOW (ref 22–32)
Calcium: 8.5 mg/dL — ABNORMAL LOW (ref 8.9–10.3)
Chloride: 117 mmol/L — ABNORMAL HIGH (ref 98–111)
Creatinine, Ser: 1.06 mg/dL (ref 0.61–1.24)
GFR, Estimated: >60 mL/min (ref >60)
Glucose, Bld: 154 mg/dL — ABNORMAL HIGH (ref 70–99)
Potassium: 3.3 mmol/L — ABNORMAL LOW (ref 3.5–5.1)
Sodium: 144 mmol/L (ref 135–145)

## 2023-05-25 LAB — GLUCOSE, CAPILLARY
Glucose-Capillary: 152 mg/dL — ABNORMAL HIGH (ref 70–99)
Glucose-Capillary: 140 mg/dL — ABNORMAL HIGH (ref 70–99)

## 2023-05-25 LAB — CULTURE, BLOOD (ROUTINE X 2): Culture: NO GROWTH

## 2023-05-25 LAB — PHOSPHORUS: Phosphorus: 2.8 mg/dL (ref 2.5–4.6)

## 2023-05-25 LAB — MAGNESIUM: Magnesium: 1.7 mg/dL (ref 1.7–2.4)

## 2023-05-25 MED ORDER — MAGNESIUM SULFATE 2 GM/50ML IV SOLN
2.0000 g | Freq: Once | INTRAVENOUS | Status: AC
  Administered 2023-05-25: 2 g via INTRAVENOUS
  Filled 2023-05-25: qty 50

## 2023-05-25 MED ORDER — POTASSIUM CHLORIDE 10 MEQ/100ML IV SOLN
10.0000 meq | INTRAVENOUS | Status: AC
  Administered 2023-05-25 (×2): 10 meq via INTRAVENOUS

## 2023-05-25 MED ORDER — LEVETIRACETAM 500 MG PO TABS
500.0000 mg | ORAL_TABLET | Freq: Two times a day (BID) | ORAL | Status: DC
  Administered 2023-05-25 – 2023-05-31 (×11): 500 mg via ORAL
  Filled 2023-05-25 (×12): qty 1

## 2023-05-25 MED ORDER — POTASSIUM CHLORIDE 20 MEQ PO PACK
40.0000 meq | PACK | Freq: Once | ORAL | Status: AC
  Administered 2023-05-25: 40 meq via ORAL
  Filled 2023-05-25: qty 2

## 2023-05-25 NOTE — Progress Notes (Signed)
 SLP Cancellation Note  Patient Details Name: Christopher Hobbs MRN: [id number] DOB: [birth year]   Cancelled treatment:       Reason Eval/Treat Not Completed: Patient declined, no reason specified. Pt pursed his lips and turned his head away with any attempt at oral care or PO presentation. SLP will continue following.    Damien Blumenthal, M.A., CF-SLP Speech Language Pathology, Acute Rehabilitation Services  Secure Chat preferred [phone]  05/25/2023, 1:36 PM

## 2023-05-25 NOTE — Plan of Care (Signed)

## 2023-05-25 NOTE — Progress Notes (Signed)
 PROGRESS NOTE        PATIENT DETAILS Name: Christopher Hobbs Age: 83 y.o. Sex: male Date of Birth: [birth year] Admit Date: 05/19/2023 Admitting Physician Camila DELENA Ned, MD ERE:Yldjpw, Ardell, MD  Brief Summary: Patient is a 83 y.o.  male with history of dementia, seizures-brought in from SNF for acute metabolic encephalopathy in the setting of AKI/hyponatremia due to failure to thrive syndrome/a UTI.  Significant events: 1/30>> admit to [facility]  Significant studies: 1/30>> CT head: No acute intracranial abnormality 1/30>> CT abdomen/pelvis: Large stool in rectosigmoid-stercoral colitis. 1/31>> EEG: No seizures 2/1>> x-ray abdomen: No SBO. 2/2>> CXR: No obvious PNA  Significant microbiology data: 1/30>> COVID/influenza/RSV PCR: Negative 1/30>> blood culture: No growth 1/31>> urine culture: E. coli (pansensitive)  Procedures: None  Consults: None  Subjective: Unchanged-awake-lethargic-oral intake is minimal/poor.  Objective: Vitals: Blood pressure 101/86, pulse (!) 119, temperature 99 F (37.2 C), temperature source Oral, resp. rate 19, height 6' (1.829 m), weight 108.9 kg, SpO2 99%.   Exam: Gen Exam: Very frail-not in any distress-awakes-mumbles. HEENT:atraumatic, normocephalic Chest: B/L clear to auscultation anteriorly CVS:S1S2 regular Abdomen:soft non tender, non distended Extremities:no edema Neurology: Physical exam-withdraws all 4 extremities to pain.   Skin: no rash  Pertinent Labs/Radiology:    Latest Ref Rng & Units 05/22/2023    5:26 AM 05/21/2023    5:36 AM 05/20/2023    4:41 AM  CBC  WBC 4.0 - 10.5 K/uL 11.9  12.2  10.5   Hemoglobin 13.0 - 17.0 g/dL 9.6  9.7  9.9   Hematocrit 39.0 - 52.0 % 31.8  33.1  33.9   Platelets 150 - 400 K/uL 174  160  169     Lab Results  Component Value Date   NA 144 05/25/2023   K 3.3 (L) 05/25/2023   CL 117 (H) 05/25/2023   CO2 19 (L) 05/25/2023      Assessment/Plan: AKI Hemodynamically  mediated in the setting of poor oral intake/dehydration Resolved with IVF/supportive care-unfortunately continues to have poor oral intake. Off IV fluids since 2/4-electrolytes stable.  Repeat tomorrow.  Hypernatremia Secondary to poor oral intake-resolved with supportive care including IVF Off all IV fluids since 2/4-oral intake continues to be poor-however electrolytes stable-repeat BMP tomorrow.   Hypokalemia Replete recheck.  Hypomagnesemia Replete/recheck  Hypophosphatemia Repleted  Acute metabolic encephalopathy Due to AKI/hypernatremia CT imaging negative Reviewed prior notes-appears to have very advanced dementia-suspect he will continue to have delirium/encephalopathy when he is hospitalized.  Constipation-fecal impaction-stercoral colitis Ensure bowel regimen Per RN-having BMs-has Flexi-Seal in place. Rocephin /Flagyl  discontinued on 2/4  Complicated UTI Completed Rocephin  x 5 days Unclear why Foley catheter was placed-will await goals of care discussion with palliative care team before contemplating a voiding trial.    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  Best served by transitioning to comfort measures.  Palliative care meeting with family later today.  Class 1 Obesity: Estimated body mass index is 32.55 kg/m as calculated from the following:    Height as of this encounter: 6' (1.829 m).   Weight as of this encounter: 108.9 kg.   Code status:   Code Status: Limited: Do not attempt resuscitation (DNR) -DNR-LIMITED -Do Not Intubate/DNI    DVT Prophylaxis: heparin  injection 5,000 Units Start: 05/20/23 0600   Family Communication: POA-Belinda [phone]-left another voicemail on 2/3.   Disposition Plan: Status is: Inpatient Remains inpatient appropriate because: Severity of illness   Planned Discharge Destination:Skilled nursing facility   Diet: Diet Order             DIET - DYS 1 Room service appropriate? No; Fluid consistency: Thin  Diet effective now                     Antimicrobial agents: Anti-infectives (From admission, onward)    Start     Dose/Rate Route Frequency Ordered Stop   05/22/23 1030  metroNIDAZOLE  (FLAGYL ) IVPB 500 mg  Status:  Discontinued        500 mg 100 mL/hr over 60 Minutes Intravenous Every 12 hours 05/22/23 0938 05/24/23 1116   05/21/23 0800  vancomycin  (VANCOREADY) IVPB 750 mg/150 mL  Status:  Discontinued        750 mg 150 mL/hr over 60 Minutes Intravenous Every 24 hours 05/20/23 0623 05/20/23 0625   05/21/23 0800  vancomycin  (VANCOCIN ) 750 mg in sodium chloride  0.9 % 250 mL IVPB  Status:  Discontinued        750 mg 250 mL/hr over 60 Minutes Intravenous Every 24 hours 05/20/23 0625 05/22/23 0938   05/21/23 0600  cefTRIAXone  (ROCEPHIN ) 1 g in sodium chloride  0.9 % 100 mL IVPB  Status:  Discontinued        1 g 200 mL/hr over 30 Minutes Intravenous Every 24 hours 05/20/23 0603 05/24/23 1116   05/20/23 0615  cefTRIAXone  (ROCEPHIN ) 1 g in sodium chloride  0.9 % 100 mL IVPB        1 g 200 mL/hr over 30 Minutes Intravenous  Once 05/20/23 0607 05/20/23 0903   05/20/23 0615  vancomycin  (VANCOREADY) IVPB 2000 mg/400 mL        2,000 mg 200 mL/hr over 120 Minutes Intravenous  Once 05/20/23 0612 05/20/23 0902        MEDICATIONS: Scheduled Meds:  Chlorhexidine  Gluconate Cloth  6  each Topical Daily   heparin   5,000 Units Subcutaneous Q8H   levETIRAcetam   500 mg Oral BID   metoprolol  tartrate  5 mg Intravenous Q6H   OXcarbazepine   150 mg Oral QHS   Oxcarbazepine   300 mg Oral Daily   potassium chloride   40 mEq Oral Once   Continuous Infusions:  magnesium  sulfate bolus IVPB     PRN Meds:.acetaminophen  **OR** acetaminophen , dextrose , ondansetron  **OR** ondansetron  (ZOFRAN ) IV   I have personally reviewed following labs and imaging studies  LABORATORY DATA: CBC: Recent Labs  Lab 05/19/23 2040 05/20/23 0441 05/21/23 0536 05/22/23 0526  WBC 9.7 10.5 12.2* 11.9*  NEUTROABS 6.6  --   --   --   HGB 9.8* 9.9* 9.7* 9.6*  HCT 33.4* 33.9* 33.1* 31.8*  MCV 95.4 94.2 97.9 90.3  PLT 152 169 160 174    Basic Metabolic Panel: Recent Labs  Lab 05/20/23 0441 05/20/23 1652 05/20/23 2222 05/21/23 0536 05/22/23 0526 05/22/23 2120 05/23/23 0142 05/23/23 0455 05/24/23 0452 05/25/23 0434  NA 155*   < > 154* 150*   < >  147* 147* 146* 144 144  K 3.9   < > 3.1* 4.3   < > 3.4* 3.3* 3.1* 3.4* 3.3*  CL 122*   < > 123* 121*   < > 122* 121* 119* 119* 117*  CO2 22   < > 21* 18*   < > 18* 18* 19* 18* 19*  GLUCOSE 218*   < > 160* 156*   < > 182* 191* 199* 188* 154*  BUN 63*   < > 45* 39*   < > 25* 24* 23 17 12   CREATININE 2.42*   < > 1.58* 1.46*   < > 1.24 1.15 1.11 1.17 1.06  CALCIUM 9.1   < > 9.1 9.0   < > 9.3 9.2 9.2 9.0 8.5*  MG 1.8  --  1.7  --   --   --   --  1.6* 1.9 1.7  PHOS  --    < > 2.2* 2.3*  --   --   --  2.4* 2.5 2.8   < > = values in this interval not displayed.    GFR: Estimated Creatinine Clearance: 69.7 mL/min (by C-G formula based on SCr of 1.06 mg/dL).  Liver Function Tests: Recent Labs  Lab 05/19/23 2040 05/20/23 0441 05/20/23 1652 05/20/23 2222 05/21/23 0536  AST 25 27  --   --   --   ALT 18 18  --   --   --   ALKPHOS 316* 349*  --   --   --   BILITOT 0.6 0.6  --   --   --   PROT 5.8* 6.7  --   --   --   ALBUMIN 2.2* 2.4* 2.3* 2.2*  2.1*   No results for input(s): LIPASE, AMYLASE in the last 168 hours. No results for input(s): AMMONIA in the last 168 hours.  Coagulation Profile: Recent Labs  Lab 05/19/23 2040  INR 1.4*    Cardiac Enzymes: Recent Labs  Lab 05/20/23 0441  CKTOTAL 866*    BNP (last 3 results) No results for input(s): PROBNP in the last 8760 hours.  Lipid Profile: No results for input(s): CHOL, HDL, LDLCALC, TRIG, CHOLHDL, LDLDIRECT in the last 72 hours.  Thyroid Function Tests: No results for input(s): TSH, T4TOTAL, FREET4, T3FREE, THYROIDAB in the last 72 hours.   Anemia Panel: No results for input(s): VITAMINB12, FOLATE, FERRITIN, TIBC, IRON, RETICCTPCT in the last 72 hours.  Urine analysis:    Component Value Date/Time   COLORURINE AMBER (A) 05/20/2023 0400   APPEARANCEUR HAZY (A) 05/20/2023 0400   LABSPEC 1.017 05/20/2023 0400   PHURINE 5.0 05/20/2023 0400   GLUCOSEU NEGATIVE 05/20/2023 0400   HGBUR LARGE (A) 05/20/2023 0400   BILIRUBINUR NEGATIVE 05/20/2023 0400   KETONESUR NEGATIVE 05/20/2023 0400   PROTEINUR NEGATIVE 05/20/2023 0400   NITRITE NEGATIVE 05/20/2023 0400   LEUKOCYTESUR NEGATIVE 05/20/2023 0400    Sepsis Labs: Lactic Acid, Venous    Component Value Date/Time   LATICACIDVEN 1.5 05/21/2023 0223    MICROBIOLOGY: Recent Results (from the past 240 hours)  Blood Culture (routine x 2)     Status: None   Collection Time: 05/19/23  8:40 PM   Specimen: BLOOD  Result Value Ref Range Status   Specimen Description BLOOD RIGHT ANTECUBITAL  Final   Special Requests   Final    BOTTLES DRAWN AEROBIC ONLY Blood Culture results may not be optimal due to an excessive volume of blood received in culture bottles   Culture  Final    NO GROWTH 5 DAYS Performed at [facility], [address], [place], [place] [address]    Report Status 05/24/2023 FINAL  Final  Blood Culture (routine x 2)     Status: None   Collection  Time: 05/19/23  8:44 PM   Specimen: BLOOD RIGHT HAND  Result Value Ref Range Status   Specimen Description BLOOD RIGHT HAND  Final   Special Requests   Final    BOTTLES DRAWN AEROBIC AND ANAEROBIC Blood Culture results may not be optimal due to an inadequate volume of blood received in culture bottles   Culture   Final    NO GROWTH 5 DAYS Performed at [facility], [address], [place], [place] [address]    Report Status 05/25/2023 FINAL  Final  Resp panel by RT-PCR (RSV, Flu A&B, Covid) Anterior Nasal Swab     Status: None   Collection Time: 05/19/23 10:30 PM   Specimen: Anterior Nasal Swab  Result Value Ref Range Status   SARS Coronavirus 2 by RT PCR NEGATIVE NEGATIVE Final   Influenza A by PCR NEGATIVE NEGATIVE Final   Influenza B by PCR NEGATIVE NEGATIVE Final    Comment: (NOTE) The Xpert Xpress SARS-CoV-2/FLU/RSV plus assay is intended as an aid in the diagnosis of influenza from Nasopharyngeal swab specimens and should not be used as a sole basis for treatment. Nasal washings and aspirates are unacceptable for Xpert Xpress SARS-CoV-2/FLU/RSV testing.  Fact Sheet for Patients: bloggercourse.com  Fact Sheet for Healthcare Providers: seriousbroker.it  This test is not yet approved or cleared by the [place]  FDA and has been authorized for detection and/or diagnosis of SARS-CoV-2 by FDA under an Emergency Use Authorization (EUA). This EUA will remain in effect (meaning this test can be used) for the duration of the COVID-19 declaration under Section 564(b)(1) of the Act, 21 U.S.C. section 360bbb-3(b)(1), unless the authorization is terminated or revoked.     Resp Syncytial Virus by PCR NEGATIVE NEGATIVE Final    Comment: (NOTE) Fact Sheet for Patients: bloggercourse.com  Fact Sheet for Healthcare Providers: seriousbroker.it  This test is not yet approved or  cleared by the [place]  FDA and has been authorized for detection and/or diagnosis of SARS-CoV-2 by FDA under an Emergency Use Authorization (EUA). This EUA will remain in effect (meaning this test can be used) for the duration of the COVID-19 declaration under Section 564(b)(1) of the Act, 21 U.S.C. section 360bbb-3(b)(1), unless the authorization is terminated or revoked.  Performed at [facility], [address], [place], [place] [address]   Urine Culture     Status: Abnormal   Collection Time: 05/20/23  4:00 AM   Specimen: Urine, Random  Result Value Ref Range Status   Specimen Description URINE, RANDOM  Final   Special Requests   Final    NONE Reflexed from [id number] Performed at [facility], [address] [place], [place] [address]    Culture >=100,000 COLONIES/mL ESCHERICHIA COLI (A)  Final   Report Status 05/22/2023 FINAL  Final   Organism ID, Bacteria ESCHERICHIA COLI (A)  Final      Susceptibility   Escherichia coli - MIC*    AMPICILLIN 8 SENSITIVE Sensitive     CEFAZOLIN <=4 SENSITIVE Sensitive     CEFEPIME <=0.12 SENSITIVE Sensitive     CEFTRIAXONE  <=0.25 SENSITIVE Sensitive     CIPROFLOXACIN <=0.25 SENSITIVE Sensitive     GENTAMICIN <=1 SENSITIVE Sensitive  IMIPENEM <=0.25 SENSITIVE Sensitive     NITROFURANTOIN <=16 SENSITIVE Sensitive     TRIMETH/SULFA <=20 SENSITIVE Sensitive     AMPICILLIN/SULBACTAM <=2 SENSITIVE Sensitive     PIP/TAZO <=4 SENSITIVE Sensitive ug/mL    * >=100,000 COLONIES/mL ESCHERICHIA COLI  Gastrointestinal Panel by PCR , Stool     Status: None   Collection Time: 05/21/23  6:23 PM   Specimen: STOOL  Result Value Ref Range Status   Campylobacter species NOT DETECTED NOT DETECTED Final   Plesimonas shigelloides NOT DETECTED NOT DETECTED Final   Salmonella species NOT DETECTED NOT DETECTED Final   Yersinia enterocolitica NOT DETECTED NOT DETECTED Final   Vibrio species NOT DETECTED NOT DETECTED Final   Vibrio cholerae  NOT DETECTED NOT DETECTED Final   Enteroaggregative E coli (EAEC) NOT DETECTED NOT DETECTED Final   Enteropathogenic E coli (EPEC) NOT DETECTED NOT DETECTED Final   Enterotoxigenic E coli (ETEC) NOT DETECTED NOT DETECTED Final   Shiga like toxin producing E coli (STEC) NOT DETECTED NOT DETECTED Final   Shigella/Enteroinvasive E coli (EIEC) NOT DETECTED NOT DETECTED Final   Cryptosporidium NOT DETECTED NOT DETECTED Final   Cyclospora cayetanensis NOT DETECTED NOT DETECTED Final   Entamoeba histolytica NOT DETECTED NOT DETECTED Final   Giardia lamblia NOT DETECTED NOT DETECTED Final   Adenovirus F40/41 NOT DETECTED NOT DETECTED Final   Astrovirus NOT DETECTED NOT DETECTED Final   Norovirus GI/GII NOT DETECTED NOT DETECTED Final   Rotavirus A NOT DETECTED NOT DETECTED Final   Sapovirus (I, II, IV, and V) NOT DETECTED NOT DETECTED Final    Comment: Performed at [facility], [address] [place], [place] [address]  C Difficile Quick Screen w PCR reflex     Status: None   Collection Time: 05/21/23  6:23 PM   Specimen: STOOL  Result Value Ref Range Status   C Diff antigen NEGATIVE NEGATIVE Final   C Diff toxin NEGATIVE NEGATIVE Final   C Diff interpretation No C. difficile detected.  Final    Comment: Performed at [facility], [address] [place], [place] [address]    RADIOLOGY STUDIES/RESULTS: No results found.    LOS: 5 days   Donalda Applebaum, MD  [facility]    To contact the attending provider between 7A-7P or the covering provider during after hours 7P-7A, please log into the web site [email or web address] and access using universal Lilly password for that web site. If you do not have the password, please call the hospital operator.  05/25/2023, 10:55 AM

## 2023-05-25 NOTE — Care Management Important Message (Signed)
 Important Message  Patient Details  Name: Christopher Hobbs MRN: [id number] Date of Birth: [birth year]   Important Message Given:  Yes - Medicare IM     Wynonia Hedges 05/25/2023, 2:54 PM

## 2023-05-25 NOTE — Progress Notes (Signed)
     Referral previously received for Beryl Novak for goals of care discussion. Chart reviewed and including last palliative medicine team note dated 05/24/2023.  I received a message that the patient's niece [name] Kays called and requested a call back to schedule a family meeting.  I was able to speak with Damian Kays.  She shares request for scheduling a family meeting while HCPOA [name] Kubas is in town, possibly tomorrow.  She called back and left a message and GOC meeting scheduled for 05/26/2023 @ 9:00am. Family is aware we will meet at patient's bedside.   Thank you for your referral and allowing PMT to assist in [facility]'s care.   Camellia Kays, NP Palliative Medicine Team Phone: [phone]  NO CHARGE

## 2023-05-25 NOTE — Progress Notes (Signed)
 Returned call to Ms. Gerard Knight at [phone] in reference to obtaining an update on her father.

## 2023-05-25 NOTE — Progress Notes (Signed)
PHARMACIST - PHYSICIAN COMMUNICATION  DR:   Jerral Ralph  CONCERNING: IV to Oral Route Change Policy  RECOMMENDATION: This patient is receiving Keppra by the intravenous route.  Based on criteria approved by the Pharmacy and Therapeutics Committee, the intravenous medication(s) is/are being converted to the equivalent oral dose form(s).   DESCRIPTION: These criteria include: The patient is eating (either orally or via tube) and/or has been taking other orally administered medications for a least 24 hours The patient has no evidence of active gastrointestinal bleeding or impaired GI absorption (gastrectomy, short bowel, patient on TNA or NPO).  If you have questions about this conversion, please contact the Pharmacy Department  []   [phone] )  [phone] []   [phone] )  [facility] [x]   [phone] )  [facility] []   [phone] )  [facility] []   [phone] )  [facility]

## 2023-05-25 NOTE — TOC Progression Note (Signed)
 Transition of Care [facility]) - Progression Note    Patient Details  Name: Christopher Hobbs MRN: [id number] Date of Birth: 09-Apr-1941  Transition of Care [facility]) CM/SW Contact  Inocente GORMAN Kindle, LCSW Phone Number: 05/25/2023, 3:38 PM  Clinical Narrative:    CSW continuing to follow.    Expected Discharge Plan: Skilled Nursing Facility Barriers to Discharge: Continued Medical Work up  Expected Discharge Plan and Services In-house Referral: Clinical Social Work, Hospice / Palliative Care   Post Acute Care Choice: Skilled Nursing Facility Living arrangements for the past 2 months: Skilled Nursing Facility                                       Social Determinants of Health (SDOH) Interventions SDOH Screenings   Food Insecurity: Patient Unable To Answer (05/20/2023)  Housing: Patient Unable To Answer (05/20/2023)  Transportation Needs: Patient Unable To Answer (05/20/2023)  Utilities: Patient Unable To Answer (05/20/2023)  Social Connections: Unknown (05/20/2023)  Tobacco Use: Low Risk  (05/20/2023)    Readmission Risk Interventions     No data to display

## 2023-05-26 DIAGNOSIS — N179 Acute kidney failure, unspecified: Secondary | ICD-10-CM | POA: Diagnosis not present

## 2023-05-26 DIAGNOSIS — Z66 Do not resuscitate: Secondary | ICD-10-CM | POA: Diagnosis not present

## 2023-05-26 DIAGNOSIS — Z515 Encounter for palliative care: Secondary | ICD-10-CM | POA: Diagnosis not present

## 2023-05-26 DIAGNOSIS — Z7189 Other specified counseling: Secondary | ICD-10-CM | POA: Diagnosis not present

## 2023-05-26 DIAGNOSIS — K5289 Other specified noninfective gastroenteritis and colitis: Secondary | ICD-10-CM | POA: Diagnosis not present

## 2023-05-26 DIAGNOSIS — E860 Dehydration: Secondary | ICD-10-CM | POA: Diagnosis not present

## 2023-05-26 LAB — BASIC METABOLIC PANEL
Anion gap: 6 (ref 5–15)
BUN: 13 mg/dL (ref 8–23)
CO2: 19 mmol/L — ABNORMAL LOW (ref 22–32)
Calcium: 8.7 mg/dL — ABNORMAL LOW (ref 8.9–10.3)
Chloride: 120 mmol/L — ABNORMAL HIGH (ref 98–111)
Creatinine, Ser: 1.03 mg/dL (ref 0.61–1.24)
GFR, Estimated: >60 mL/min (ref >60)
Glucose, Bld: 189 mg/dL — ABNORMAL HIGH (ref 70–99)
Potassium: 3.9 mmol/L (ref 3.5–5.1)
Sodium: 145 mmol/L (ref 135–145)

## 2023-05-26 LAB — MAGNESIUM: Magnesium: 2 mg/dL (ref 1.7–2.4)

## 2023-05-26 LAB — GLUCOSE, CAPILLARY: Glucose-Capillary: 152 mg/dL — ABNORMAL HIGH (ref 70–99)

## 2023-05-26 MED ORDER — HALOPERIDOL LACTATE 5 MG/ML IJ SOLN: 0.5000 mg | INTRAMUSCULAR | Status: DC | PRN

## 2023-05-26 MED ORDER — LORAZEPAM 1 MG PO TABS
1.0000 mg | ORAL_TABLET | ORAL | Status: DC | PRN
  Administered 2023-05-30 – 2023-05-31 (×2): 1 mg via ORAL
  Filled 2023-05-26 (×2): qty 1

## 2023-05-26 MED ORDER — BIOTENE DRY MOUTH MT LIQD: 15.0000 mL | OROMUCOSAL | Status: DC | PRN

## 2023-05-26 MED ORDER — LORAZEPAM 2 MG/ML PO CONC
1.0000 mg | ORAL | Status: DC | PRN
  Filled 2023-05-26: qty 0.5

## 2023-05-26 MED ORDER — LORAZEPAM 2 MG/ML IJ SOLN: 1.0000 mg | INTRAMUSCULAR | Status: DC | PRN

## 2023-05-26 MED ORDER — GLYCOPYRROLATE 1 MG PO TABS: 1.0000 mg | ORAL_TABLET | ORAL | Status: DC | PRN

## 2023-05-26 MED ORDER — HALOPERIDOL 0.5 MG PO TABS: 0.5000 mg | ORAL_TABLET | ORAL | Status: DC | PRN

## 2023-05-26 MED ORDER — POLYVINYL ALCOHOL 1.4 % OP SOLN: 1.0000 [drp] | Freq: Four times a day (QID) | OPHTHALMIC | Status: DC | PRN

## 2023-05-26 MED ORDER — MORPHINE SULFATE (PF) 2 MG/ML IV SOLN: 1.0000 mg | INTRAVENOUS | Status: DC | PRN

## 2023-05-26 MED ORDER — GLYCOPYRROLATE 0.2 MG/ML IJ SOLN: 0.2000 mg | INTRAMUSCULAR | Status: DC | PRN

## 2023-05-26 MED ORDER — HALOPERIDOL LACTATE 2 MG/ML PO CONC
0.5000 mg | ORAL | Status: DC | PRN
  Filled 2023-05-26: qty 5

## 2023-05-26 NOTE — TOC Progression Note (Signed)
 Transition of Care [facility]) - Progression Note    Patient Details  Name: Christopher Hobbs MRN: [id number] Date of Birth: 1940/09/08  Transition of Care [facility]) CM/SW Contact  Inocente GORMAN Kindle, LCSW Phone Number: 05/26/2023, 2:26 PM  Clinical Narrative:    CSW received request for [facility] to follow patient upon return to [facility] per family request. CSW spoke with Hospice who is working with [name] to see if they can obtain a contract for services there.    Expected Discharge Plan: Skilled Nursing Facility Barriers to Discharge: Continued Medical Work up  Expected Discharge Plan and Services In-house Referral: Clinical Social Work, Hospice / Palliative Care   Post Acute Care Choice: Hospice, Skilled Nursing Facility Living arrangements for the past 2 months: Skilled Nursing Facility                                       Social Determinants of Health (SDOH) Interventions SDOH Screenings   Food Insecurity: Patient Unable To Answer (05/20/2023)  Housing: Patient Unable To Answer (05/20/2023)  Transportation Needs: Patient Unable To Answer (05/20/2023)  Utilities: Patient Unable To Answer (05/20/2023)  Social Connections: Unknown (05/20/2023)  Tobacco Use: Low Risk  (05/20/2023)    Readmission Risk Interventions     No data to display

## 2023-05-26 NOTE — Progress Notes (Signed)
   Referral received today for hospice services at [facility] per family request. Patient has been approved for hospice services by our physician.  [facility]) does not currently have a contract with [employer] [facility]. We have been in contact with their administrator who requested a one-time contract be emailed for review with their legal team and corporate office. This has been emailed according to our VP of Patent Examiner. We are awaiting a response from the SNF after this has been reviewed. If contract is approved, they will obtain an order for hospice admission from their provider as well.  I have left a voicemail for HCPOA/niece, Belinda, requesting callback to discuss referral.  Feel free to reach out with questions.  Rolland Cordia, [facility] [phone]

## 2023-05-26 NOTE — Progress Notes (Signed)
 This chaplain responded to PMT NP-Eric's consult for Pt. spiritual care in the setting of facing end of life. The Pt. is resting comfortably at the time of the visit, without family at the beside.   The chaplain understands the Pt. family requested prayer and presence with the Pt.   The chaplain reviewed the Pt. chart notes and connected the family's storytelling with the Pt. faith through her presence and prayer.  This chaplain is available for F/U spiritual care as needed.  Chaplain Leeroy Hummer [phone]

## 2023-05-26 NOTE — Progress Notes (Signed)
 PROGRESS NOTE        PATIENT DETAILS Name: Christopher Hobbs Age: 83 y.o. Sex: male Date of Birth: [birth year] Admit Date: 05/19/2023 Admitting Physician Camila DELENA Ned, MD ERE:Yldjpw, Ardell, MD  Brief Summary: Patient is a 83 y.o.  male with history of dementia, seizures-brought in from SNF for acute metabolic encephalopathy in the setting of AKI/hyponatremia due to failure to thrive syndrome/a UTI.  Significant events: 1/30>> admit to [facility]  Significant studies: 1/30>> CT head: No acute intracranial abnormality 1/30>> CT abdomen/pelvis: Large stool in rectosigmoid-stercoral colitis. 1/31>> EEG: No seizures 2/1>> x-ray abdomen: No SBO. 2/2>> CXR: No obvious PNA  Significant microbiology data: 1/30>> COVID/influenza/RSV PCR: Negative 1/30>> blood culture: No growth 1/31>> urine culture: E. coli (pansensitive)  Procedures: None  Consults: None  Subjective: Remains lethargic-unchanged.  Objective: Vitals: Blood pressure (!) 127/58, pulse (!) 57, temperature 98.8 F (37.1 C), temperature source Axillary, resp. rate 19, height 6' (1.829 m), weight 108.9 kg, SpO2 100%.   Exam: Gen Exam: Very frail-not in any distress-awakes-mumbles. HEENT:atraumatic, normocephalic Chest: B/L clear to auscultation anteriorly CVS:S1S2 regular Abdomen:soft non tender, non distended Extremities:no edema Neurology: Physical exam-withdraws all 4 extremities to pain.   Skin: no rash  Pertinent Labs/Radiology:    Latest Ref Rng & Units 05/22/2023    5:26 AM 05/21/2023    5:36 AM 05/20/2023    4:41 AM  CBC  WBC 4.0 - 10.5 K/uL 11.9  12.2  10.5   Hemoglobin 13.0 - 17.0 g/dL 9.6  9.7  9.9   Hematocrit 39.0 - 52.0 % 31.8  33.1  33.9   Platelets 150 - 400 K/uL 174  160  169     Lab Results  Component Value Date   NA 145 05/26/2023   K 3.9 05/26/2023   CL 120 (H) 05/26/2023   CO2 19 (L) 05/26/2023      Assessment/Plan: AKI Hemodynamically mediated in the  setting of poor oral intake/dehydration Resolved with IVF/supportive care-unfortunately continues to have poor oral intake. Off IV fluids since 2/4-renal function stable but sodium slowly trending up.  Suspect that he will redevelop AKI in the near future.  Hypernatremia Secondary to poor oral intake-resolved with supportive care including IVF Off all IV fluids since 2/4-oral intake continues to be poor-sodium levels are slowly inching up.  Will likely develop hypernatremia in the near future.  Family meeting scheduled for later today.  Hypokalemia Repleted  Hypomagnesemia Repleted  Hypophosphatemia Repleted  Acute metabolic encephalopathy Due to AKI/hypernatremia CT imaging negative Reviewed prior notes-appears to have very advanced dementia-suspect he will continue to have delirium/encephalopathy when he is hospitalized.  Constipation-fecal impaction-stercoral colitis Ensure bowel regimen Per RN-having BMs-has Flexi-Seal in place. Rocephin /Flagyl  discontinued on 2/4  Complicated UTI Completed Rocephin  x 5 days Unclear why Foley catheter was placed-will await goals of care discussion with palliative care team before contemplating a voiding trial.    Normocytic anemia No evidence of blood loss Likely related to chronic disease/IV fluid dilution/acute illness Follow CBC  Dementia Expect delirium-maintain delirium precautions Per prior notes-depending on nursing staff for ADLs-SNF resident  Seizure disorder CT imaging/EEG stable Continue Trileptal /Keppra   Severe failure to thrive syndrome Palliative care DNR in place Poor candidate for any further escalation in care-suspect will not do well regardless if he survives this hospitalization-very high risk that he will probably have recurrent electrolyte derangements-given the fact  that he has advanced dementia and severe failure to thrive syndrome.  Best served by transitioning to comfort measures.  Palliative care meeting  with family later today.  Class 1 Obesity: Estimated body mass index is 32.55 kg/m as calculated from the following:   Height as of this encounter: 6' (1.829 m).   Weight as of this encounter: 108.9 kg.   Code status:   Code Status: Limited: Do not attempt resuscitation (DNR) -DNR-LIMITED -Do Not Intubate/DNI    DVT Prophylaxis: heparin  injection 5,000 Units Start: 05/20/23 0600   Family Communication: POA-Belinda Gill-[phone]-left another voicemail on 2/3.   Disposition Plan: Status is: Inpatient Remains inpatient appropriate because: Severity of illness   Planned Discharge Destination:Skilled nursing facility   Diet: Diet Order             DIET - DYS 1 Room service appropriate? No; Fluid consistency: Thin  Diet effective now                     Antimicrobial agents: Anti-infectives (From admission, onward)    Start     Dose/Rate Route Frequency Ordered Stop   05/22/23 1030  metroNIDAZOLE  (FLAGYL ) IVPB 500 mg  Status:  Discontinued        500 mg 100 mL/hr over 60 Minutes Intravenous Every 12 hours 05/22/23 0938 05/24/23 1116   05/21/23 0800  vancomycin  (VANCOREADY) IVPB 750 mg/150 mL  Status:  Discontinued        750 mg 150 mL/hr over 60 Minutes Intravenous Every 24 hours 05/20/23 0623 05/20/23 0625   05/21/23 0800  vancomycin  (VANCOCIN ) 750 mg in sodium chloride  0.9 % 250 mL IVPB  Status:  Discontinued        750 mg 250 mL/hr over 60 Minutes Intravenous Every 24 hours 05/20/23 0625 05/22/23 0938   05/21/23 0600  cefTRIAXone  (ROCEPHIN ) 1 g in sodium chloride  0.9 % 100 mL IVPB  Status:  Discontinued        1 g 200 mL/hr over 30 Minutes Intravenous Every 24 hours 05/20/23 0603 05/24/23 1116   05/20/23 0615  cefTRIAXone  (ROCEPHIN ) 1 g in sodium chloride  0.9 % 100 mL IVPB        1 g 200 mL/hr over 30 Minutes Intravenous  Once 05/20/23 0607 05/20/23 0903   05/20/23 0615  vancomycin  (VANCOREADY) IVPB 2000 mg/400 mL        2,000 mg 200 mL/hr over 120 Minutes  Intravenous  Once 05/20/23 0612 05/20/23 0902        MEDICATIONS: Scheduled Meds:  Chlorhexidine  Gluconate Cloth  6 each Topical Daily   heparin   5,000 Units Subcutaneous Q8H   levETIRAcetam   500 mg Oral BID   metoprolol  tartrate  5 mg Intravenous Q6H   OXcarbazepine   150 mg Oral QHS   Oxcarbazepine   300 mg Oral Daily   Continuous Infusions:   PRN Meds:.acetaminophen  **OR** acetaminophen , dextrose , ondansetron  **OR** ondansetron  (ZOFRAN ) IV   I have personally reviewed following labs and imaging studies  LABORATORY DATA: CBC: Recent Labs  Lab 05/19/23 2040 05/20/23 0441 05/21/23 0536 05/22/23 0526  WBC 9.7 10.5 12.2* 11.9*  NEUTROABS 6.6  --   --   --   HGB 9.8* 9.9* 9.7* 9.6*  HCT 33.4* 33.9* 33.1* 31.8*  MCV 95.4 94.2 97.9 90.3  PLT 152 169 160 174    Basic Metabolic Panel: Recent Labs  Lab 05/20/23 2222 05/21/23 0536 05/22/23 0526 05/23/23 0142 05/23/23 0455 05/24/23 0452 05/25/23 0434 05/26/23 0504  NA 154* 150*   < >  147* 146* 144 144 145  K 3.1* 4.3   < > 3.3* 3.1* 3.4* 3.3* 3.9  CL 123* 121*   < > 121* 119* 119* 117* 120*  CO2 21* 18*   < > 18* 19* 18* 19* 19*  GLUCOSE 160* 156*   < > 191* 199* 188* 154* 189*  BUN 45* 39*   < > 24* 23 17 12 13   CREATININE 1.58* 1.46*   < > 1.15 1.11 1.17 1.06 1.03  CALCIUM 9.1 9.0   < > 9.2 9.2 9.0 8.5* 8.7*  MG 1.7  --   --   --  1.6* 1.9 1.7 2.0  PHOS 2.2* 2.3*  --   --  2.4* 2.5 2.8  --    < > = values in this interval not displayed.    GFR: Estimated Creatinine Clearance: 71.7 mL/min (by C-G formula based on SCr of 1.03 mg/dL).  Liver Function Tests: Recent Labs  Lab 05/19/23 2040 05/20/23 0441 05/20/23 1652 05/20/23 2222 05/21/23 0536  AST 25 27  --   --   --   ALT 18 18  --   --   --   ALKPHOS 316* 349*  --   --   --   BILITOT 0.6 0.6  --   --   --   PROT 5.8* 6.7  --   --   --   ALBUMIN 2.2* 2.4* 2.3* 2.2* 2.1*   No results for input(s): LIPASE, AMYLASE in the last 168 hours. No  results for input(s): AMMONIA in the last 168 hours.  Coagulation Profile: Recent Labs  Lab 05/19/23 2040  INR 1.4*    Cardiac Enzymes: Recent Labs  Lab 05/20/23 0441  CKTOTAL 866*    BNP (last 3 results) No results for input(s): PROBNP in the last 8760 hours.  Lipid Profile: No results for input(s): CHOL, HDL, LDLCALC, TRIG, CHOLHDL, LDLDIRECT in the last 72 hours.  Thyroid Function Tests: No results for input(s): TSH, T4TOTAL, FREET4, T3FREE, THYROIDAB in the last 72 hours.   Anemia Panel: No results for input(s): VITAMINB12, FOLATE, FERRITIN, TIBC, IRON, RETICCTPCT in the last 72 hours.  Urine analysis:    Component Value Date/Time   COLORURINE AMBER (A) 05/20/2023 0400   APPEARANCEUR HAZY (A) 05/20/2023 0400   LABSPEC 1.017 05/20/2023 0400   PHURINE 5.0 05/20/2023 0400   GLUCOSEU NEGATIVE 05/20/2023 0400   HGBUR LARGE (A) 05/20/2023 0400   BILIRUBINUR NEGATIVE 05/20/2023 0400   KETONESUR NEGATIVE 05/20/2023 0400   PROTEINUR NEGATIVE 05/20/2023 0400   NITRITE NEGATIVE 05/20/2023 0400   LEUKOCYTESUR NEGATIVE 05/20/2023 0400    Sepsis Labs: Lactic Acid, Venous    Component Value Date/Time   LATICACIDVEN 1.5 05/21/2023 0223    MICROBIOLOGY: Recent Results (from the past 240 hours)  Blood Culture (routine x 2)     Status: None   Collection Time: 05/19/23  8:40 PM   Specimen: BLOOD  Result Value Ref Range Status   Specimen Description BLOOD RIGHT ANTECUBITAL  Final   Special Requests   Final    BOTTLES DRAWN AEROBIC ONLY Blood Culture results may not be optimal due to an excessive volume of blood received in culture bottles   Culture   Final    NO GROWTH 5 DAYS Performed at [facility], [address], [place], [place] [address]    Report Status 05/24/2023 FINAL  Final  Blood Culture (routine x 2)     Status: None   Collection Time:  05/19/23  8:44 PM   Specimen: BLOOD RIGHT HAND  Result Value Ref Range  Status   Specimen Description BLOOD RIGHT HAND  Final   Special Requests   Final    BOTTLES DRAWN AEROBIC AND ANAEROBIC Blood Culture results may not be optimal due to an inadequate volume of blood received in culture bottles   Culture   Final    NO GROWTH 5 DAYS Performed at [facility], [address], [place], [place] [address]    Report Status 05/25/2023 FINAL  Final  Resp panel by RT-PCR (RSV, Flu A&B, Covid) Anterior Nasal Swab     Status: None   Collection Time: 05/19/23 10:30 PM   Specimen: Anterior Nasal Swab  Result Value Ref Range Status   SARS Coronavirus 2 by RT PCR NEGATIVE NEGATIVE Final   Influenza A by PCR NEGATIVE NEGATIVE Final   Influenza B by PCR NEGATIVE NEGATIVE Final    Comment: (NOTE) The Xpert Xpress SARS-CoV-2/FLU/RSV plus assay is intended as an aid in the diagnosis of influenza from Nasopharyngeal swab specimens and should not be used as a sole basis for treatment. Nasal washings and aspirates are unacceptable for Xpert Xpress SARS-CoV-2/FLU/RSV testing.  Fact Sheet for Patients: bloggercourse.com  Fact Sheet for Healthcare Providers: seriousbroker.it  This test is not yet approved or cleared by the [place]  FDA and has been authorized for detection and/or diagnosis of SARS-CoV-2 by FDA under an Emergency Use Authorization (EUA). This EUA will remain in effect (meaning this test can be used) for the duration of the COVID-19 declaration under Section 564(b)(1) of the Act, 21 U.S.C. section 360bbb-3(b)(1), unless the authorization is terminated or revoked.     Resp Syncytial Virus by PCR NEGATIVE NEGATIVE Final    Comment: (NOTE) Fact Sheet for Patients: bloggercourse.com  Fact Sheet for Healthcare Providers: seriousbroker.it  This test is not yet approved or cleared by the [place]  FDA and has been authorized for detection and/or  diagnosis of SARS-CoV-2 by FDA under an Emergency Use Authorization (EUA). This EUA will remain in effect (meaning this test can be used) for the duration of the COVID-19 declaration under Section 564(b)(1) of the Act, 21 U.S.C. section 360bbb-3(b)(1), unless the authorization is terminated or revoked.  Performed at [facility], [address], [place], [place] [address]   Urine Culture     Status: Abnormal   Collection Time: 05/20/23  4:00 AM   Specimen: Urine, Random  Result Value Ref Range Status   Specimen Description URINE, RANDOM  Final   Special Requests   Final    NONE Reflexed from [id number] Performed at [facility], [address], [place], [place] [address]    Culture >=100,000 COLONIES/mL ESCHERICHIA COLI (A)  Final   Report Status 05/22/2023 FINAL  Final   Organism ID, Bacteria ESCHERICHIA COLI (A)  Final      Susceptibility   Escherichia coli - MIC*    AMPICILLIN 8 SENSITIVE Sensitive     CEFAZOLIN <=4 SENSITIVE Sensitive     CEFEPIME <=0.12 SENSITIVE Sensitive     CEFTRIAXONE  <=0.25 SENSITIVE Sensitive     CIPROFLOXACIN <=0.25 SENSITIVE Sensitive     GENTAMICIN <=1 SENSITIVE Sensitive     IMIPENEM <=0.25 SENSITIVE Sensitive     NITROFURANTOIN <=16 SENSITIVE Sensitive     TRIMETH/SULFA <=20 SENSITIVE Sensitive     AMPICILLIN/SULBACTAM <=2 SENSITIVE Sensitive     PIP/TAZO <=4 SENSITIVE Sensitive ug/mL    * >=100,000 COLONIES/mL ESCHERICHIA  COLI  Gastrointestinal Panel by PCR , Stool     Status: None   Collection Time: 05/21/23  6:23 PM   Specimen: STOOL  Result Value Ref Range Status   Campylobacter species NOT DETECTED NOT DETECTED Final   Plesimonas shigelloides NOT DETECTED NOT DETECTED Final   Salmonella species NOT DETECTED NOT DETECTED Final   Yersinia enterocolitica NOT DETECTED NOT DETECTED Final   Vibrio species NOT DETECTED NOT DETECTED Final   Vibrio cholerae NOT DETECTED NOT DETECTED Final   Enteroaggregative E coli (EAEC) NOT DETECTED  NOT DETECTED Final   Enteropathogenic E coli (EPEC) NOT DETECTED NOT DETECTED Final   Enterotoxigenic E coli (ETEC) NOT DETECTED NOT DETECTED Final   Shiga like toxin producing E coli (STEC) NOT DETECTED NOT DETECTED Final   Shigella/Enteroinvasive E coli (EIEC) NOT DETECTED NOT DETECTED Final   Cryptosporidium NOT DETECTED NOT DETECTED Final   Cyclospora cayetanensis NOT DETECTED NOT DETECTED Final   Entamoeba histolytica NOT DETECTED NOT DETECTED Final   Giardia lamblia NOT DETECTED NOT DETECTED Final   Adenovirus F40/41 NOT DETECTED NOT DETECTED Final   Astrovirus NOT DETECTED NOT DETECTED Final   Norovirus GI/GII NOT DETECTED NOT DETECTED Final   Rotavirus A NOT DETECTED NOT DETECTED Final   Sapovirus (I, II, IV, and V) NOT DETECTED NOT DETECTED Final    Comment: Performed at [facility], [address] [place], [place] [address]  C Difficile Quick Screen w PCR reflex     Status: None   Collection Time: 05/21/23  6:23 PM   Specimen: STOOL  Result Value Ref Range Status   C Diff antigen NEGATIVE NEGATIVE Final   C Diff toxin NEGATIVE NEGATIVE Final   C Diff interpretation No C. difficile detected.  Final    Comment: Performed at [facility], [address] [place], [place] [address]    RADIOLOGY STUDIES/RESULTS: No results found.    LOS: 6 days   Donalda Applebaum, MD  [facility]    To contact the attending provider between 7A-7P or the covering provider during after hours 7P-7A, please log into the web site [email or web address] and access using universal  password for that web site. If you do not have the password, please call the hospital operator.  05/26/2023, 9:55 AM

## 2023-05-26 NOTE — Progress Notes (Signed)
 Daily Progress Note   Patient Name: Christopher Hobbs       Date: 05/26/2023 DOB: 1941-03-01  Age: 83 y.o. MRN#: [id number] Attending Physician: Raenelle Donalda HERO, MD Primary Care Physician: Ransom Other, MD Admit Date: 05/19/2023 Length of Stay: 6 days  Reason for Consultation/Follow-up: Establishing goals of care  HPI/Patient Profile:  83 y.o. male with past medical history of dementia and seizure presented to ED from facility with AMS and hypotension. Patient was admitted on 05/19/2023 with severe dehydration leading to AKI, sepsis, hypernatremia/hypokalemia, and acute metabolic encephalopathy.   Subjective:   Subjective: Chart Reviewed. Updates received. Patient Assessed. Created space and opportunity for patient  and family to explore thoughts and feelings regarding current medical situation.  Today's Discussion: Today saw the patient bedside, present were nieces Shana and Belinda.  We excused ourselves to a conference room to discuss with the patient.  Understanding of illness: Today nieces share that they know that he was out of facility [facility] and was transferred to [facility] for sporadic episodes of not breathing but this was not noted in the ED.  He was diagnosed with dementia in 2018 until recently he was up ambulating, eating by himself.  He went to [facility] because of wandering concerns.  About 1 year ago he became nonambulatory.  They note that he had had an acceleration in his decline around COVID due to isolation.  In the past 6 months he is again accelerated more rapidly.  They are concerned about his overall health.  We spent significant time discussing his current health situation.  Life Review: He completed a brief life review.  The patient was previously a scientist, water quality.  He is allowed to eat and so not eating is an obvious sign to them that he is not himself and likely progressing.  He does not have any children.  He is described as very getting and very generous.  He was a  caring man and would give people money for lunch or other generosity's simply because he could.  He also helped educate and raise many children and took care of people as his children even though he did not have children.  Other Discussion: We had extensive discussion on various topics besides as described above.  They note that he is on seizure medications which are somewhat sedating, although now they are listed as preventative.  He did see a neurologist but they were not aware of that as it occurred per the facility without notification.  They have not noted him to ever have seizures.  They describe concerns about his care at his facility.  At 1 point they were told he is dying he just need to let him go and signed up with hospice.  They were supposed to meet a hospice liaison from a company that his facility usually works with but that liaison never showed and when they ask around where she could be noted a facility at her to the liaison or the company.  They are quite concerned about what the situation may be.  We spent excessive time talking about his decline, overall health, and options moving forward.  Except he is approaching end-of-life.  At this point they would like to make sure that he is comfortable.  We discussed comfort care. I explained comfort care as care where the patient would no longer receive aggressive medical interventions such as continuous vital signs, lab work, radiology testing, or medications not focused on comfort, peace, and dignity. This includes  stopping antibiotics and weaning oxygen to room air, as these are generally not accepted as providing comfort but only prolonging the dying process artificially. All care would focus on how the patient is looking and feeling. This would include management of any symptoms that may cause discomfort, pain, shortness of breath/air hunger, increased work of breathing, cough, nausea, agitation/restlessness, anxiety, and/or secretions etc.  Symptoms would be managed with medications and other non-pharmacological interventions such as spiritual support if requested, repositioning, music therapy, or therapeutic listening. Family verbalized understanding and agreement.  They have agreed to transition to comfort care.  We also discussed hospice care as an option for her at discharge. I described hospice as a service for patients who have a life expectancy of 6 months or less. The goal of hospice is the preservation of dignity and quality at the end phases of life. Under hospice care, the focus changes from curative to symptom relief. I explained the three setting where hospice services can be provided including the home, at a living facility (such as LTC SNF, Assisted Living, etc), and a hospice facility. I explained that acceptance to hospice in any specific location is the final decision of the hospice medical director and bed availability, if applicable. They verbalized understanding.  They have agreed to hospice care.  I offered choice and they have requested that [facility] [place] get involved.  They are adamant about not using the company that [facility] says that they usually work with.  I called notified of smell for the liaison from [facility] [place] to discuss.  Return to the room and the patient was sleeping.  I elected to not wake him but provided a brief exam.  I shared with the family that I would follow-up when I hear back from [facility] [place].  I shared the palliative medicine will continue to see him daily while he is admitted to the hospital on comfort care.  Finally, I offered spiritual care consult as the patient has a strong faith basis.  His family eagerly agreed.  I provided emotional and general support through therapeutic listening, empathy, sharing of stories, therapeutic touch, and other techniques. I answered all questions and addressed all concerns to the best of my ability.  At the end of my visit  I was able to speak with the hospice liaison from [facility] [place].  I shared explicit nation and situation.  She states she will reach out to [facility] and see if they can obtain a contract to provide care there.  She will notify me when I have an answer and if able to there we will work him up for admission.  Review of Systems  Unable to perform ROS   Objective:   Vital Signs:  BP (!) 127/58 (BP Location: Left Arm)   Pulse (!) 57   Temp 98.8 F (37.1 C) (Axillary)   Resp 19   Ht 6' (1.829 m)   Wt 108.9 kg   SpO2 100%   BMI 32.55 kg/m   Physical Exam Vitals and nursing note reviewed.  Constitutional:      General: He is sleeping. He is not in acute distress.    Appearance: He is ill-appearing.  HENT:     Head: Normocephalic and atraumatic.  Cardiovascular:     Rate and Rhythm: Normal rate and regular rhythm.  Pulmonary:     Effort: Pulmonary effort is normal. No respiratory distress.     Breath sounds: No wheezing  or rhonchi.  Abdominal:     General: Abdomen is flat. Bowel sounds are normal. There is no distension.     Palpations: Abdomen is soft.  Skin:    General: Skin is warm and dry.     Palliative Assessment/Data: 20-30%    Existing Vynca/ACP Documentation: None  Assessment & Plan:   Impression: Present on Admission:  AKI (acute kidney injury) (HCC)  84 year old male with acute presentation chronic comorbidities as described above.  The patient is approaching end-of-life, has had a more rapid acceleration of his dementia.  He is not eating and drinking enough to sustain himself, although he does occasionally eat if he is hand fed.  Family understands the situation and they agree that he is approaching end-of-life.  We have elected to transition to comfort care and engaged with hospice services at his long-term care facility [facility].  We will continue to help coordinate this and work with them to obtain the best care possible.  Overall prognosis  poor.  SUMMARY OF RECOMMENDATIONS   DNR-comfort Transition to comfort care See symptom management orders below Continued emotional support of patient and family Consult to spiritual care for chaplaincy support Consult to [facility] for hospice referral Palliative medicine will continue support for comfort care  Symptom Management:  Tylenol  650 mg PR every 6 hours as needed mild pain or fever Robinul  0.2 mg IV every 4 hours as needed excessive secretions Haldol  0.5 mg IV every 4 hours as needed agitation or delirium Ativan  1 mg IV every 4 hours as needed anxiety CONTINUE Keppra  500 mg oral twice daily for seizure prophylaxis Morphine  1 to 4 mg IV every 15 minutes as needed severe pain or signs of pain/distress Zofran  4 mg IV every 6 hours as needed nausea Polyvinyl alcohol  1.4% ophthalmic 1 drop OU 4 times daily as needed dry eyes  Code Status: DNR-comfort  Prognosis: < 6 months  Discharge Planning: Skilled Nursing Facility with Hospice  Discussed with: Patient's family, medical team, nursing team, [facility] team, hospice liaison  Thank you for allowing us  to participate in the care of Liron Eissler PMT will continue to support holistically.  Time Total: 119 min  Detailed review of medical records (labs, imaging, vital signs), medically appropriate exam, discussed with treatment team, counseling and education to patient, family, & staff, documenting clinical information, medication management, coordination of care  Camellia Kays, NP Palliative Medicine Team  Team Phone # [phone] (Nights/Weekends)  12/16/2020, 8:17 AM

## 2023-05-27 DIAGNOSIS — Z66 Do not resuscitate: Secondary | ICD-10-CM | POA: Diagnosis not present

## 2023-05-27 DIAGNOSIS — N179 Acute kidney failure, unspecified: Secondary | ICD-10-CM | POA: Diagnosis not present

## 2023-05-27 DIAGNOSIS — Z7189 Other specified counseling: Secondary | ICD-10-CM | POA: Diagnosis not present

## 2023-05-27 DIAGNOSIS — Z515 Encounter for palliative care: Secondary | ICD-10-CM | POA: Diagnosis not present

## 2023-05-27 MED ORDER — ORAL CARE MOUTH RINSE
15.0000 mL | OROMUCOSAL | Status: DC
  Administered 2023-05-27 – 2023-05-31 (×15): 15 mL via OROMUCOSAL

## 2023-05-27 MED ORDER — MORPHINE SULFATE (CONCENTRATE) 10 MG /0.5 ML PO SOLN: 5.0000 mg | ORAL | Status: DC | PRN

## 2023-05-27 MED ORDER — ORAL CARE MOUTH RINSE: 15.0000 mL | OROMUCOSAL | Status: DC | PRN

## 2023-05-27 MED ORDER — MORPHINE SULFATE (CONCENTRATE) 10 MG /0.5 ML PO SOLN: 5.0000 mg | ORAL | 0 refills | Status: DC | PRN

## 2023-05-27 MED ORDER — LORAZEPAM 2 MG/ML PO CONC: 1.0000 mg | ORAL | 0 refills | Status: AC | PRN

## 2023-05-27 MED ORDER — HALOPERIDOL 2 MG PO TABS: 2.0000 mg | ORAL_TABLET | Freq: Four times a day (QID) | ORAL | 0 refills | Status: AC | PRN

## 2023-05-27 NOTE — NC FL2 (Signed)
 [place]  MEDICAID [id number] LEVEL OF CARE FORM     IDENTIFICATION  Patient Name: Christopher Hobbs Birthdate: [birth year] Sex: male Admission Date (Current Location): 05/19/2023  [facility] and [place] Number:  Producer, Television/film/video and Address:  [facility], [address] [place], [place] [address]      Provider Number: [id number]  Attending Physician Name and Address:  Raenelle Donalda HERO, MD  Relative Name and Phone Number:       Current Level of Care: Hospital Recommended Level of Care: Skilled Nursing Facility Prior Approval Number:    Date Approved/Denied:   PASRR Number: [id number]  Discharge Plan: SNF    Current Diagnoses: Patient Active Problem List   Diagnosis Date Noted   AKI (acute kidney injury) (HCC) 05/20/2023    Orientation RESPIRATION BLADDER Height & Weight      (Disoriented x4)  Normal Incontinent, Indwelling catheter Weight: 240 lb (108.9 kg) Height:  6' (182.9 cm)  BEHAVIORAL SYMPTOMS/MOOD NEUROLOGICAL BOWEL NUTRITION STATUS      Incontinent Diet (See dc summary)  AMBULATORY STATUS COMMUNICATION OF NEEDS Skin   Extensive Assist Verbally Other (Comment) (wound on tibial and coccyx)                       Personal Care Assistance Level of Assistance  Bathing, Feeding, Dressing Bathing Assistance: Maximum assistance Feeding assistance: Maximum assistance Dressing Assistance: Maximum assistance     Functional Limitations Info             SPECIAL CARE FACTORS FREQUENCY                       Contractures Contractures Info: Not present    Additional Factors Info  Code Status, Allergies Code Status Info: DNR Allergies Info: NKA           Current Medications (05/27/2023):  This is the current hospital active medication list Current Facility-Administered Medications  Medication Dose Route Frequency Provider Last Rate Last Admin   acetaminophen  (TYLENOL ) tablet 650 mg  650 mg Oral Q6H PRN Thomas, Sara-Maiz A,  MD   650 mg at 05/20/23 1627   Or   acetaminophen  (TYLENOL ) suppository 650 mg  650 mg Rectal Q6H PRN Debby Camila LABOR, MD   650 mg at 05/20/23 2128   antiseptic oral rinse (BIOTENE) solution 15 mL  15 mL Topical PRN Marvis Camellia LABOR, NP       dextrose  50 % solution 50 mL  1 ampule Intravenous PRN Sundil, Subrina, MD       glycopyrrolate  (ROBINUL ) tablet 1 mg  1 mg Oral Q4H PRN Marvis Camellia LABOR, NP       Or   glycopyrrolate  (ROBINUL ) injection 0.2 mg  0.2 mg Subcutaneous Q4H PRN Marvis Camellia LABOR, NP       Or   glycopyrrolate  (ROBINUL ) injection 0.2 mg  0.2 mg Intravenous Q4H PRN Marvis Camellia LABOR, NP       haloperidol  (HALDOL ) tablet 0.5 mg  0.5 mg Oral Q4H PRN Marvis Camellia LABOR, NP       Or   haloperidol  (HALDOL ) 2 MG/ML solution 0.5 mg  0.5 mg Sublingual Q4H PRN Marvis Camellia LABOR, NP       Or   haloperidol  lactate (HALDOL ) injection 0.5 mg  0.5 mg Intravenous Q4H PRN Marvis Camellia LABOR, NP       levETIRAcetam  (KEPPRA ) tablet 500 mg  500 mg Oral BID Pham, Minh Q, RPH-CPP  500 mg at 05/26/23 9144   LORazepam  (ATIVAN ) tablet 1 mg  1 mg Oral Q4H PRN Marvis Camellia LABOR, NP       Or   LORazepam  (ATIVAN ) 2 MG/ML concentrated solution 1 mg  1 mg Sublingual Q4H PRN Marvis Camellia LABOR, NP       Or   LORazepam  (ATIVAN ) injection 1 mg  1 mg Intravenous Q4H PRN Gill, Eric A, NP       morphine  (PF) 2 MG/ML injection 1-4 mg  1-4 mg Intravenous Q15 min PRN Marvis Camellia LABOR, NP       morphine  CONCENTRATE 10 mg / 0.5 ml oral solution 5 mg  5 mg Oral Q2H PRN Ghimire, Donalda HERO, MD       ondansetron  (ZOFRAN ) tablet 4 mg  4 mg Oral Q6H PRN Debby Camila LABOR, MD       Or   ondansetron  (ZOFRAN ) injection 4 mg  4 mg Intravenous Q6H PRN Thomas, Sara-Maiz A, MD       Oral care mouth rinse  15 mL Mouth Rinse 4 times per day Raenelle Donalda HERO, MD   15 mL at 05/27/23 1204   Oral care mouth rinse  15 mL Mouth Rinse PRN Raenelle Donalda HERO, MD       OXcarbazepine  (TRILEPTAL ) tablet 150 mg  150 mg Oral QHS Thomas, Sara-Maiz A, MD   150 mg at 05/25/23 2133    Oxcarbazepine  (TRILEPTAL ) tablet 300 mg  300 mg Oral Daily Thomas, Sara-Maiz A, MD   300 mg at 05/26/23 0856   polyvinyl alcohol  (LIQUIFILM TEARS) 1.4 % ophthalmic solution 1 drop  1 drop Both Eyes QID PRN Marvis Camellia LABOR, NP         Discharge Medications: Please see discharge summary for a list of discharge medications.  Relevant Imaging Results:  Relevant Lab Results:   Additional Information SSN: [id number]. Hospice to follow at SNF.  Aadith Raudenbush S Adyn Hoes, LCSW

## 2023-05-27 NOTE — TOC Progression Note (Addendum)
 Transition of Care [facility]) - Progression Note    Patient Details  Name: Christopher Hobbs MRN: [id number] Date of Birth: [birth year]  [facility] CM/SW Contact  Inocente GORMAN Kindle, LCSW Phone Number: 05/27/2023, 9:35 AM  Clinical Narrative:    9:35 AM-Per [name], they can accept patient back today and they will approve [facility].   CSW spoke with [place] with [facility] who reported they still need the contract signed and returned to them as well as orders from [facility] MD prior to being able to initiate services. HOP has not been able to reach patient's niece, [name], to sign paperwork either. CSW notified [facility]. If patient returns without those thing in place, he will not have hospice upon arrival. Will update MD.   3:32 PM-CSW spoke with [name] who stated they cannot get Hospice orders and the revised contract until Monday when their MD is there. CSW updated patient's nieces, [name], and [name], and they reported not being comfortable with patient going back to [facility] without Hospice. [facility] aware and was able to speak with nieces. MD updated.     Expected Discharge Plan: Skilled Nursing Facility Barriers to Discharge: Continued Medical Work up  Expected Discharge Plan and Services In-house Referral: Clinical Social Work, Hospice / Palliative Care   Post Acute Care Choice: Hospice, Skilled Nursing Facility Living arrangements for the past 2 months: Skilled Nursing Facility                                       Social Determinants of Health (SDOH) Interventions SDOH Screenings   Food Insecurity: Patient Unable To Answer (05/20/2023)  Housing: Patient Unable To Answer (05/20/2023)  Transportation Needs: Patient Unable To Answer (05/20/2023)  Utilities: Patient Unable To Answer (05/20/2023)  Social Connections: Unknown (05/20/2023)  Tobacco Use: Low Risk  (05/20/2023)    Readmission Risk Interventions      No data to display

## 2023-05-27 NOTE — Progress Notes (Signed)
 Pt repositioned in bed and appears comfortable.

## 2023-05-27 NOTE — Plan of Care (Signed)

## 2023-05-27 NOTE — Progress Notes (Signed)
 PROGRESS NOTE        PATIENT DETAILS Name: Christopher Hobbs Age: 83 y.o. Sex: male Date of Birth: 10-01-1940 Admit Date: 05/19/2023 Admitting Physician Camila DELENA Ned, MD ERE:Yldjpw, Ardell, MD  Brief Summary: Patient is a 83 y.o.  male with history of dementia, seizures-brought in from SNF for acute metabolic encephalopathy in the setting of AKI/hyponatremia due to failure to thrive syndrome/a UTI.  Significant events: 1/30>> admit to [facility] 2/06>>transition to comfort care  Significant studies: 1/30>> CT head: No acute intracranial abnormality 1/30>> CT abdomen/pelvis: Large stool in rectosigmoid-stercoral colitis. 1/31>> EEG: No seizures 2/1>> x-ray abdomen: No SBO. 2/2>> CXR: No obvious PNA  Significant microbiology data: 1/30>> COVID/influenza/RSV PCR: Negative 1/30>> blood culture: No growth 1/31>> urine culture: E. coli (pansensitive)  Procedures: None  Consults: None  Subjective: Awake-lethargic-continues to have poor oral intake.  Objective: Vitals: Blood pressure (!) 125/102, pulse (!) 57, temperature 99.1 F (37.3 C), temperature source Axillary, resp. rate 19, height 6' (1.829 m), weight 108.9 kg, SpO2 100%.   Exam: Gen Exam: Very frail-not in any distress-awakes-mumbles. HEENT:atraumatic, normocephalic Chest: B/L clear to auscultation anteriorly CVS:S1S2 regular Abdomen:soft non tender, non distended Extremities:no edema Neurology: Physical exam-withdraws all 4 extremities to pain.   Skin: no rash  Pertinent Labs/Radiology:    Latest Ref Rng & Units 05/22/2023    5:26 AM 05/21/2023    5:36 AM 05/20/2023    4:41 AM  CBC  WBC 4.0 - 10.5 K/uL 11.9  12.2  10.5   Hemoglobin 13.0 - 17.0 g/dL 9.6  9.7  9.9   Hematocrit 39.0 - 52.0 % 31.8  33.1  33.9   Platelets 150 - 400 K/uL 174  160  169     Lab Results  Component Value Date   NA 145 05/26/2023   K 3.9 05/26/2023   CL 120 (H) 05/26/2023   CO2 19 (L) 05/26/2023       Assessment/Plan: AKI Hemodynamically mediated in the setting of poor oral intake/dehydration Resolved with IVF/supportive care-unfortunately continues to have poor oral intake. Off IV fluids since 2/4-renal function stable but sodium slowly trending up.  Suspect that he will redevelop AKI in the near future.  After family meeting with palliative care on 2/6-transition to full comfort measures.   Hypernatremia Secondary to poor oral intake-resolved with supportive care including IVF Off all IV fluids since 2/4-oral intake continues to be poor-sodium levels are slowly inching up.  Will likely develop hypernatremia in the near future.   After family meeting with palliative care on 2/6-transition to full comfort measures.   Hypokalemia Repleted   Hypomagnesemia Repleted   Hypophosphatemia Repleted   Acute metabolic encephalopathy Due to AKI/hypernatremia CT imaging negative Reviewed prior notes-appears to have very advanced dementia-suspect he will continue to have delirium/encephalopathy when he is hospitalized.   Constipation-fecal impaction-stercoral colitis Ensure bowel regimen Per RN-having BMs-has Flexi-Seal in place. Rocephin /Flagyl  discontinued on 2/4   Complicated UTI Completed Rocephin  x 5 days Unclear why Foley catheter was placed-since he has been transitioned to full comfort measures-plan is to keep Foley catheter in place for comfort.   Normocytic anemia No evidence of blood loss Likely related to chronic disease/IV fluid dilution/acute illness   Dementia Expect delirium-maintain delirium precautions Per prior notes-depending on nursing staff for ADLs-SNF resident   Seizure disorder CT imaging/EEG stable Continue Trileptal /Keppra  being continued for comfort.  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  Palliative care met with family on 2/6-now full comfort measures-plan is to discharge back to SNF with hospice care.     Class 1 Obesity: Estimated body mass index is 32.55 kg/m as calculated from the following:   Height as of this encounter: 6' (1.829 m).   Weight as of this encounter: 108.9 kg.     Class 1 Obesity: Estimated body mass index is 32.55 kg/m as calculated from the following:   Height as of this encounter: 6' (1.829 m).   Weight as of this encounter: 108.9 kg.   Code status:   Code Status: Do not attempt resuscitation (DNR) - Comfort care   DVT Prophylaxis:    Family Communication: POA-Belinda [phone]-left another voicemail on 2/3.   Disposition Plan: Status is: Inpatient Remains inpatient appropriate because: Severity of illness   Planned Discharge Destination:Skilled nursing facility   Diet: Diet Order             Diet general           DIET - DYS 1 Room service appropriate? No; Fluid consistency: Thin  Diet effective now                     Antimicrobial agents: Anti-infectives (From admission, onward)    Start     Dose/Rate Route Frequency Ordered Stop   05/22/23 1030  metroNIDAZOLE  (FLAGYL ) IVPB 500 mg  Status:  Discontinued        500 mg 100 mL/hr over 60 Minutes Intravenous Every 12 hours 05/22/23 0938 05/24/23 1116   05/21/23 0800  vancomycin  (VANCOREADY) IVPB 750 mg/150 mL  Status:  Discontinued        750 mg 150 mL/hr over 60 Minutes Intravenous Every 24 hours 05/20/23 0623 05/20/23 0625   05/21/23 0800  vancomycin  (VANCOCIN ) 750 mg in sodium chloride  0.9 % 250 mL IVPB  Status:  Discontinued        750 mg 250 mL/hr over 60 Minutes Intravenous Every 24 hours 05/20/23 0625 05/22/23 0938   05/21/23 0600  cefTRIAXone  (ROCEPHIN ) 1 g in sodium chloride  0.9 % 100 mL IVPB  Status:  Discontinued        1  g 200 mL/hr over 30 Minutes Intravenous Every 24 hours 05/20/23 0603 05/24/23 1116   05/20/23 0615  cefTRIAXone  (ROCEPHIN ) 1 g in sodium chloride  0.9 % 100 mL IVPB        1 g 200 mL/hr over 30 Minutes Intravenous  Once 05/20/23 0607 05/20/23 0903   05/20/23 0615  vancomycin  (VANCOREADY) IVPB 2000 mg/400 mL        2,000 mg 200 mL/hr over 120 Minutes Intravenous  Once 05/20/23 0612 05/20/23 0902        MEDICATIONS: Scheduled Meds:  levETIRAcetam   500 mg Oral BID   mouth rinse  15 mL Mouth Rinse 4 times per day   OXcarbazepine   150 mg Oral QHS   Oxcarbazepine   300 mg Oral Daily   Continuous Infusions:   PRN Meds:.acetaminophen  **OR** acetaminophen , antiseptic oral rinse, dextrose , glycopyrrolate  **OR** glycopyrrolate  **OR** glycopyrrolate , haloperidol  **OR** haloperidol  **OR** haloperidol  lactate, LORazepam  **OR** LORazepam  **OR** LORazepam , morphine  injection, morphine  CONCENTRATE, ondansetron  **OR** ondansetron  (ZOFRAN ) IV, mouth rinse, polyvinyl alcohol    I have personally reviewed following labs and imaging studies  LABORATORY DATA: CBC: Recent Labs  Lab 05/21/23 0536 05/22/23 0526  WBC 12.2* 11.9*  HGB 9.7* 9.6*  HCT 33.1* 31.8*  MCV 97.9 90.3  PLT 160 174    Basic Metabolic Panel: Recent Labs  Lab 05/20/23 2222 05/21/23 0536 05/22/23 0526 05/23/23 0142 05/23/23 0455 05/24/23 0452 05/25/23 0434 05/26/23 0504  NA 154* 150*   < > 147* 146* 144 144 145  K 3.1* 4.3   < > 3.3* 3.1* 3.4* 3.3* 3.9  CL 123* 121*   < > 121* 119* 119* 117* 120*  CO2 21* 18*   < > 18* 19* 18* 19* 19*  GLUCOSE 160* 156*   < > 191* 199* 188* 154* 189*  BUN 45* 39*   < > 24* 23 17 12 13   CREATININE 1.58* 1.46*   < > 1.15 1.11 1.17 1.06 1.03  CALCIUM 9.1 9.0   < > 9.2 9.2 9.0 8.5* 8.7*  MG 1.7  --   --   --  1.6* 1.9 1.7 2.0  PHOS 2.2* 2.3*  --   --  2.4* 2.5 2.8  --    < > = values in this interval not displayed.    GFR: Estimated Creatinine Clearance: 71.7 mL/min (by C-G formula  based on SCr of 1.03 mg/dL).  Liver Function Tests: Recent Labs  Lab 05/20/23 1652 05/20/23 2222 05/21/23 0536  ALBUMIN 2.3* 2.2* 2.1*   No results for input(s): LIPASE, AMYLASE in the last 168 hours. No results for input(s): AMMONIA in the last 168 hours.  Coagulation Profile: No results for input(s): INR, PROTIME in the last 168 hours.   Cardiac Enzymes: No results for input(s): CKTOTAL, CKMB, CKMBINDEX, TROPONINI in the last 168 hours.   BNP (last 3 results) No results for input(s): PROBNP in the last 8760 hours.  Lipid Profile: No results for input(s): CHOL, HDL, LDLCALC, TRIG, CHOLHDL, LDLDIRECT in the last 72 hours.  Thyroid Function Tests: No results for input(s): TSH, T4TOTAL, FREET4, T3FREE, THYROIDAB in the last 72 hours.   Anemia Panel: No results for input(s): VITAMINB12, FOLATE, FERRITIN, TIBC, IRON, RETICCTPCT in the last 72 hours.  Urine analysis:    Component Value Date/Time   COLORURINE AMBER (A) 05/20/2023 0400   APPEARANCEUR HAZY (A) 05/20/2023 0400   LABSPEC 1.017 05/20/2023 0400   PHURINE 5.0 05/20/2023 0400   GLUCOSEU NEGATIVE 05/20/2023 0400   HGBUR LARGE (A) 05/20/2023 0400   BILIRUBINUR NEGATIVE 05/20/2023 0400   KETONESUR NEGATIVE 05/20/2023 0400   PROTEINUR NEGATIVE 05/20/2023 0400   NITRITE NEGATIVE 05/20/2023 0400   LEUKOCYTESUR NEGATIVE 05/20/2023 0400    Sepsis Labs: Lactic Acid, Venous    Component Value Date/Time   LATICACIDVEN 1.5 05/21/2023 0223    MICROBIOLOGY: Recent Results (from the past 240 hours)  Blood Culture (routine x 2)     Status: None   Collection Time: 05/19/23  8:40 PM   Specimen: BLOOD  Result Value Ref Range Status   Specimen Description BLOOD RIGHT ANTECUBITAL  Final   Special Requests   Final    BOTTLES DRAWN AEROBIC ONLY Blood Culture results may not be optimal due to an excessive volume of blood received in culture bottles   Culture   Final     NO GROWTH 5 DAYS Performed at [facility], [address] [place], [place] [address]    Report Status 05/24/2023 FINAL  Final  Blood Culture (routine x 2)     Status: None   Collection Time: 05/19/23  8:44 PM   Specimen: BLOOD RIGHT HAND  Result Value Ref Range Status   Specimen Description BLOOD RIGHT HAND  Final   Special Requests   Final    BOTTLES DRAWN AEROBIC AND ANAEROBIC Blood Culture results may not be optimal due to an inadequate volume of blood received in culture bottles   Culture   Final    NO GROWTH 5 DAYS Performed at [facility], [address] [place], [place] [address]    Report Status 05/25/2023 FINAL  Final  Resp panel by RT-PCR (RSV, Flu A&B, Covid) Anterior Nasal Swab     Status: None   Collection Time: 05/19/23 10:30 PM   Specimen: Anterior Nasal Swab  Result Value Ref Range Status   SARS Coronavirus 2 by RT PCR NEGATIVE NEGATIVE Final   Influenza A by PCR NEGATIVE NEGATIVE Final   Influenza B by PCR NEGATIVE NEGATIVE Final    Comment: (NOTE) The Xpert Xpress SARS-CoV-2/FLU/RSV plus assay is intended as an aid in the diagnosis of influenza from Nasopharyngeal swab specimens and should not be used as a sole basis for treatment. Nasal washings and aspirates are unacceptable for Xpert Xpress SARS-CoV-2/FLU/RSV testing.  Fact Sheet for Patients: bloggercourse.com  Fact Sheet for Healthcare Providers: seriousbroker.it  This test is not yet approved or cleared by the [place]  FDA and has been authorized for detection and/or diagnosis of SARS-CoV-2 by FDA under an Emergency Use Authorization (EUA). This EUA will remain in effect (meaning this test can be used) for the duration of the COVID-19 declaration under Section 564(b)(1) of the Act, 21 U.S.C. section 360bbb-3(b)(1), unless the authorization is terminated or revoked.     Resp Syncytial Virus by PCR NEGATIVE NEGATIVE Final     Comment: (NOTE) Fact Sheet for Patients: bloggercourse.com  Fact Sheet for Healthcare Providers: seriousbroker.it  This test is not yet approved or cleared by the [place]  FDA and has been authorized for detection and/or diagnosis of SARS-CoV-2 by FDA under an Emergency Use Authorization (EUA). This EUA will remain in effect (meaning this test can be used) for the duration of the COVID-19 declaration under Section 564(b)(1) of the Act, 21 U.S.C. section 360bbb-3(b)(1), unless the authorization is terminated or revoked.  Performed at [facility], [address], [place], [place] [address]   Urine Culture     Status: Abnormal   Collection Time: 05/20/23  4:00 AM   Specimen: Urine, Random  Result Value Ref Range Status   Specimen Description URINE, RANDOM  Final   Special Requests   Final    NONE Reflexed from [id number] Performed at [facility], [address], [place], [place] [address]    Culture >=100,000 COLONIES/mL ESCHERICHIA COLI (A)  Final   Report Status 05/22/2023 FINAL  Final   Organism ID, Bacteria ESCHERICHIA COLI (A)  Final      Susceptibility   Escherichia coli - MIC*    AMPICILLIN 8 SENSITIVE Sensitive     CEFAZOLIN <=4 SENSITIVE Sensitive     CEFEPIME <=0.12 SENSITIVE Sensitive     CEFTRIAXONE  <=0.25 SENSITIVE Sensitive     CIPROFLOXACIN <=0.25 SENSITIVE Sensitive     GENTAMICIN <=1 SENSITIVE Sensitive     IMIPENEM <=0.25 SENSITIVE Sensitive     NITROFURANTOIN <=16 SENSITIVE Sensitive     TRIMETH/SULFA <=20 SENSITIVE Sensitive     AMPICILLIN/SULBACTAM <=2 SENSITIVE Sensitive     PIP/TAZO <=4 SENSITIVE Sensitive ug/mL    * >=100,000 COLONIES/mL ESCHERICHIA COLI  Gastrointestinal  Panel by PCR , Stool     Status: None   Collection Time: 05/21/23  6:23 PM   Specimen: STOOL  Result Value Ref Range Status   Campylobacter species NOT DETECTED NOT DETECTED Final   Plesimonas shigelloides NOT  DETECTED NOT DETECTED Final   Salmonella species NOT DETECTED NOT DETECTED Final   Yersinia enterocolitica NOT DETECTED NOT DETECTED Final   Vibrio species NOT DETECTED NOT DETECTED Final   Vibrio cholerae NOT DETECTED NOT DETECTED Final   Enteroaggregative E coli (EAEC) NOT DETECTED NOT DETECTED Final   Enteropathogenic E coli (EPEC) NOT DETECTED NOT DETECTED Final   Enterotoxigenic E coli (ETEC) NOT DETECTED NOT DETECTED Final   Shiga like toxin producing E coli (STEC) NOT DETECTED NOT DETECTED Final   Shigella/Enteroinvasive E coli (EIEC) NOT DETECTED NOT DETECTED Final   Cryptosporidium NOT DETECTED NOT DETECTED Final   Cyclospora cayetanensis NOT DETECTED NOT DETECTED Final   Entamoeba histolytica NOT DETECTED NOT DETECTED Final   Giardia lamblia NOT DETECTED NOT DETECTED Final   Adenovirus F40/41 NOT DETECTED NOT DETECTED Final   Astrovirus NOT DETECTED NOT DETECTED Final   Norovirus GI/GII NOT DETECTED NOT DETECTED Final   Rotavirus A NOT DETECTED NOT DETECTED Final   Sapovirus (I, II, IV, and V) NOT DETECTED NOT DETECTED Final    Comment: Performed at [facility], [address] [place], [place] [address]  C Difficile Quick Screen w PCR reflex     Status: None   Collection Time: 05/21/23  6:23 PM   Specimen: STOOL  Result Value Ref Range Status   C Diff antigen NEGATIVE NEGATIVE Final   C Diff toxin NEGATIVE NEGATIVE Final   C Diff interpretation No C. difficile detected.  Final    Comment: Performed at [facility], [address], [place], [place] [address]    RADIOLOGY STUDIES/RESULTS: No results found.    LOS: 7 days   Donalda Applebaum, MD  [facility]    To contact the attending provider between 7A-7P or the covering provider during after hours 7P-7A, please log into the web site [email or web address] and access using universal [facility] password for that web site. If you do not have the password, please call the hospital operator.  05/27/2023,  11:05 AM

## 2023-05-27 NOTE — Progress Notes (Signed)
 Daily Progress Note   Patient Name: Christopher Hobbs       Date: 05/27/2023 DOB: 04-12-1941  Age: 83 y.o. MRN#: [id number] Attending Physician: Raenelle Donalda HERO, MD Primary Care Physician: Ransom Other, MD Admit Date: 05/19/2023 Length of Stay: 7 days  Reason for Consultation/Follow-up: Establishing goals of care  HPI/Patient Profile:  83 y.o. male with past medical history of dementia and seizure presented to ED from facility with AMS and hypotension. Patient was admitted on 05/19/2023 with severe dehydration leading to AKI, sepsis, hypernatremia/hypokalemia, and acute metabolic encephalopathy.   Subjective:   Subjective: Chart Reviewed. Updates received. Patient Assessed. Created space and opportunity for patient  and family to explore thoughts and feelings regarding current medical situation.  Today's Discussion: Today saw the patient bedside, no family was present.  The patient appeared comfortable, was awake and alert and pleasantly babbling in bed.  I provided reassurance to him about his family leaving him and that he is going to get great care.  I celebrated with him that he is likely leaving the hospital today.  After saw the patient I called and spoke with his niece Shayna.  Other niece/HCPOA [name] is with her.  I celebrated the fact that the patient is likely going to [facility] with hosp[facility].  Nieces are happy about this but were unaware.  I shared that TOC is attempted to call but has not been able to reach [place].  Likewise hospice the [facility] is also awaiting for [facility] to sign the contract, although they verbally said they will accept [facility] [place] in house for one-time contract.  [name] states that she will anticipate a call and I will reach out to the hospice liaison to see how her that [facility] is awaiting a call to be able to approve paperwork.  Hospice liaison indicated she will reach out to try to complete paperwork to finalize  transfer.  I provided emotional and general support to the patient through reassurance and therapeutic touch and to family through therapeutic listening, empathy, and other techniques. I answered all questions and addressed all concerns to the best of my ability.  Review of Systems  Unable to perform ROS   Objective:   Vital Signs:  BP (!) 125/102 (BP Location: Right Arm)   Pulse (!) 57   Temp 99.1 F (37.3 C) (Axillary)   Resp 19   Ht 6' (1.829 m)   Wt 108.9 kg   SpO2 100%   BMI 32.55 kg/m   Physical Exam Vitals and nursing note reviewed.  Constitutional:      General: He is sleeping. He is not in acute distress.    Appearance: He is ill-appearing.     Comments: Pleasantly babbling, no apparent distress or discomfort  HENT:     Head: Normocephalic and atraumatic.  Cardiovascular:     Rate and Rhythm: Normal rate.  Pulmonary:     Effort: Pulmonary effort is normal. No respiratory distress.     Breath sounds: No wheezing or rhonchi.  Abdominal:     General: Abdomen is flat. Bowel sounds are normal. There is no distension.     Palpations: Abdomen is soft.  Skin:    General: Skin is warm and dry.     Palliative Assessment/Data: 20-30%    Existing Vynca/ACP Documentation: None  Assessment & Plan:   Impression: Present on Admission:  AKI (acute kidney injury) (HCC)  83 year old male with acute presentation chronic comorbidities as described above.  The patient is approaching end-of-life, has had a more rapid acceleration of his dementia.  He is not eating and drinking enough to sustain himself, although he does occasionally eat if he is hand fed.  Family understands the situation and they agree that he is approaching end-of-life.  We have elected to transition to comfort care and engaged with hospice services at his long-term care facility [facility].  Appears that [facility] has accepted patient back with hospice in place, pending final signing of one-time  contract by [facility] and family signing of paperwork by family. We will continue to help coordinate this and work with them to obtain the best care possible.  Overall prognosis poor.  SUMMARY OF RECOMMENDATIONS   DNR-comfort Continue comfort care See symptom management orders below Continued emotional support of patient and family Anticipate d/c to [facility] LTC with [facility] [place] in place Palliative medicine will continue support for comfort care  Symptom Management:  Tylenol  650 mg PR every 6 hours as needed mild pain or fever Robinul  0.2 mg IV every 4 hours as needed excessive secretions Haldol  0.5 mg IV every 4 hours as needed agitation or delirium Ativan  1 mg IV every 4 hours as needed anxiety CONTINUE Keppra  500 mg oral twice daily for seizure prophylaxis Morphine  1 to 4 mg IV every 15 minutes as needed severe pain or signs of pain/distress Zofran  4 mg IV every 6 hours as needed nausea Polyvinyl alcohol  1.4% ophthalmic 1 drop OU 4 times daily as needed dry eyes  Code Status: DNR-comfort  Prognosis: < 6 months  Discharge Planning: Skilled Nursing Facility with Hospice  Discussed with: Patient's family, medical team, nursing team, [facility] team, hospice liaison  Thank you for allowing us  to participate in the care of Makana Rostad PMT will continue to support holistically.  Time Total: 27 min  Detailed review of medical records (labs, imaging, vital signs), medically appropriate exam, discussed with treatment team, counseling and education to patient, family, & staff, documenting clinical information, medication management, coordination of care  Camellia Kays, NP Palliative Medicine Team  Team Phone # [phone] (Nights/Weekends)  12/16/2020, 8:17 AM

## 2023-05-27 NOTE — Discharge Summary (Addendum)
 PATIENT DETAILS Name: Christopher Hobbs Age: 83 y.o. Sex: male Date of Birth: 07/04/1940 MRN: [id number]. Admitting Physician: Camila DELENA Ned, MD ERE:Yldjpw, Ardell, MD  Admit Date: 05/19/2023 Discharge date: 05/31/2023  Recommendations for Outpatient Follow-up:  Optimize comfort measures  Admitted From:  SNF  Disposition: Skilled nursing facility with Hospice   Discharge Condition: poor  CODE STATUS:   Code Status: Do not attempt resuscitation (DNR) - Comfort care   Diet recommendation:  Diet Order             Diet general           DIET - DYS 1 Room service appropriate? No; Fluid consistency: Thin  Diet effective now                    Brief Summary: Patient is a 83 y.o.  male with history of dementia, seizures-brought in from SNF for acute metabolic encephalopathy in the setting of AKI/hyponatremia due to failure to thrive syndrome/a UTI.   Significant events: 1/30>> admit to [facility] 2/06>>transition to comfort care   Significant studies: 1/30>> CT head: No acute intracranial abnormality 1/30>> CT abdomen/pelvis: Large stool in rectosigmoid-stercoral colitis. 1/31>> EEG: No seizures 2/1>> x-ray abdomen: No SBO. 2/2>> CXR: No obvious PNA   Significant microbiology data: 1/30>> COVID/influenza/RSV PCR: Negative 1/30>> blood culture: No growth 1/31>> urine culture: E. coli (pansensitive)   Procedures: None   Consults: None  Brief Hospital Course: AKI Hemodynamically mediated in the setting of poor oral intake/dehydration Resolved with IVF/supportive care-unfortunately continues to have poor oral intake. Off IV fluids since 2/4-renal function stable but sodium slowly trending up.  Suspect that he will redevelop AKI in the near future.  After family meeting with palliative care on 2/6-transition to full comfort measures.   Hypernatremia Secondary to poor oral intake-resolved with supportive care including IVF Off all IV fluids since 2/4-oral intake  continues to be poor-sodium levels are slowly inching up.  Will likely develop hypernatremia in the near future.   After family meeting with palliative care on 2/6-transition to full comfort measures.   Hypokalemia Repleted   Hypomagnesemia Repleted   Hypophosphatemia Repleted   Acute metabolic encephalopathy Due to AKI/hypernatremia CT imaging negative Reviewed prior notes-appears to have very advanced dementia-suspect he will continue to have delirium/encephalopathy when he is hospitalized.   Constipation-fecal impaction-stercoral colitis Ensure bowel regimen Per RN-having BMs-has Flexi-Seal in place. Rocephin /Flagyl  discontinued on 2/4   Complicated UTI Completed Rocephin  x 5 days Unclear why Foley catheter was placed-since he has been transitioned to full comfort measures-plan is to keep Foley catheter in place for comfort.   Normocytic anemia No evidence of blood loss Likely related to chronic disease/IV fluid dilution/acute illness   Dementia Expect delirium-maintain delirium precautions Per prior notes-depending on nursing staff for ADLs-SNF resident   Seizure disorder CT imaging/EEG stable Continue Trileptal /Keppra  being continued for comfort.   Severe failure to thrive syndrome Palliative care DNR in place Advanced dementia-very poor oral intake-severe failure to thrive syndrome-presented with hypernatremia and AKI-continues to have very poor oral intake during this hospitalization-at risk for recurrent electrolyte derangement/dehydration-repeat hospitalization.  Palliative care met with family on 2/6-now full comfort measures-plan is to discharge back to SNF with hospice care.     Class 1 Obesity: Estimated body mass index is 32.55 kg/m as calculated from the following:   Height as of this encounter: 6' (1.829 m).   Weight as of this encounter: 108.9 kg.    Discharge Diagnoses:  Principal  Problem:   AKI (acute kidney injury) [facility])   Discharge  Instructions:  Activity:  As tolerated with Full fall precautions use walker/cane & assistance as needed   Discharge Instructions     Call MD for:  difficulty breathing, headache or visual disturbances   Complete by: As directed    Diet general   Complete by: As directed    Discharge instructions   Complete by: As directed        Discharge instructions   Complete by: As directed    Disposition.  Nursing home with hospice Condition.  Guarded CODE STATUS.  DNR Activity.  With assistance as tolerated, full fall precautions. Diet.  Soft with feeding assistance and aspiration precautions. Goal of care.  Comfort.   Increase activity slowly   Complete by: As directed    No wound care   Complete by: As directed    No wound care   Complete by: As directed       Allergies as of 05/31/2023   No Known Allergies      Medication List     STOP taking these medications    acetaminophen  500 MG tablet Commonly known as: TYLENOL    chlorhexidine  0.12 % solution Commonly known as: PERIDEX    cyanocobalamin 1000 MCG tablet Commonly known as: VITAMIN B12   doxycycline 100 MG tablet Commonly known as: ADOXA   feeding supplement (PRO-STAT 64) Liqd   Feeding Supplies Misc   fluticasone 50 MCG/ACT nasal spray Commonly known as: FLONASE   furosemide 40 MG tablet Commonly known as: LASIX   hydrochlorothiazide 12.5 MG tablet Commonly known as: HYDRODIURIL   lisinopril 10 MG tablet Commonly known as: ZESTRIL   losartan 25 MG tablet Commonly known as: COZAAR   melatonin 3 MG Tabs tablet   mirtazapine 15 MG tablet Commonly known as: REMERON   OLANZapine 2.5 MG tablet Commonly known as: ZYPREXA   Vitamin D3 1.25 MG (50000 UT) Tabs       TAKE these medications    haloperidol  2 MG tablet Commonly known as: HALDOL  Take 1 tablet (2 mg total) by mouth every 6 (six) hours as needed for agitation (or delirium).   levETIRAcetam  500 MG tablet Commonly known as:  Keppra  Take 1 tablet (500 mg total) by mouth 2 (two) times daily.   LORazepam  2 MG/ML concentrated solution Commonly known as: ATIVAN  Place 0.5 mLs (1 mg total) under the tongue every 4 (four) hours as needed for anxiety.   morphine  CONCENTRATE 10 mg / 0.5 ml concentrated solution Take 0.25-0.5 mLs (5-10 mg total) by mouth every 2 (two) hours as needed for severe pain (pain score 7-10), anxiety or shortness of breath (seadtion, comfort).   Oxcarbazepine  300 MG tablet Commonly known as: TRILEPTAL  Take 300 mg by mouth daily. Stable mood   OXcarbazepine  150 MG tablet Commonly known as: TRILEPTAL  Take 150 mg by mouth at bedtime.        Follow-up Information     Husain, Karrar, MD. Schedule an appointment as soon as possible for a visit.   Specialty: Internal Medicine Why: As needed Contact information: [address] [place] [address] [phone]                No Known Allergies   Other Procedures/Studies: DG CHEST PORT 1 VIEW Result Date: 05/22/2023 CLINICAL DATA:  Shortness of breath EXAM: PORTABLE CHEST 1 VIEW COMPARISON:  05/19/2023 FINDINGS: Artifact from EKG leads. Unchanged elevation of the left diaphragm atelectasis by recent  abdominal CT. Normal heart size. Extensive artifact from EKG leads. 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 IMPRESSION: Unchanged retrocardiac opacity and volume loss correlating with atelectasis on recent abdominal CT. Electronically Signed   By: Dorn Roulette M.D.   On: 05/22/2023 07:51   DG Abd 1 View Result Date: 05/21/2023 CLINICAL DATA:  Constipation EXAM: ABDOMEN - 1 VIEW COMPARISON:  None Available. FINDINGS: The bowel gas pattern is normal. Small stool burden. No radio-opaque calculi or other significant radiographic abnormality are seen. IMPRESSION: 1. Negative. Electronically Signed   By: Dorethia Molt M.D.   On: 05/21/2023 21:28   EEG adult Result  Date: 05/20/2023 Shelton Arlin KIDD, MD     05/20/2023 10:49 AM Patient Name: Jasyah Theurer MRN: [id number] Epilepsy Attending: Arlin KIDD Shelton Referring Physician/Provider: Debby Camila LABOR, MD Date: 05/20/2023 Duration: 23.47 mins Patient history: 83yo M with ams getting eeg to evaluate for seizure Level of alertness: Awake AEDs during EEG study: LEV, OXC Technical aspects: This EEG study was done with scalp electrodes positioned according to the 10-20 International system of electrode placement. Electrical activity was reviewed with band pass filter of 1-70Hz , sensitivity of 7 uV/mm, display speed of 68mm/sec with a 60Hz  notched filter applied as appropriate. EEG data were recorded continuously and digitally stored.  Video monitoring was available and reviewed as appropriate. Description: EEG showed continuous generalized 3 to 6 Hz theta-delta slowing. Hyperventilation and photic stimulation were not performed.   ABNORMALITY - Continuous slow, generalized IMPRESSION: This study is suggestive of moderate diffuse encephalopathy. No seizures or epileptiform discharges were seen throughout the recording. Arlin KIDD Shelton   DG Ankle 2 Views Right Result Date: 05/20/2023 CLINICAL DATA:  83 year old male with history of abscess. EXAM: RIGHT ANKLE - 2 VIEW COMPARISON:  No priors. FINDINGS: Two views of the right ankle demonstrate no acute displaced fracture, subluxation or dislocation. No definite destructive bony lesions. Soft tissues are grossly unremarkable. IMPRESSION: 1. No acute radiographic abnormality of the right ankle. Electronically Signed   By: Toribio Aye M.D.   On: 05/20/2023 06:25   CT ABDOMEN PELVIS WO CONTRAST Result Date: 05/20/2023 CLINICAL DATA:  Abdominal pain. EXAM: CT ABDOMEN AND PELVIS WITHOUT CONTRAST TECHNIQUE: Multidetector CT imaging of the abdomen and pelvis was performed following the standard protocol without IV contrast. RADIATION DOSE REDUCTION: This exam was performed according  to the departmental dose-optimization program which includes automated exposure control, adjustment of the mA and/or kV according to patient size and/or use of iterative reconstruction technique. COMPARISON:  None Available. FINDINGS: Lower chest: There is atelectasis in the lung bases. Hepatobiliary: There is a 6.9 by 6.4 cm hypodense lesion in the right lobe of the liver measuring 9 Hounsfield units. The gallbladder and bile ducts are within normal limits. Pancreas: Unremarkable. No pancreatic ductal dilatation or surrounding inflammatory changes. Spleen: Normal in size without focal abnormality. Adrenals/Urinary Tract: There is a punctate nonobstructing right renal calculus. No ureteral or bladder calculi are seen. No hydronephrosis. The adrenal glands are within normal limits. Bladder is within normal limits, displaced into the anterior abdomen. Stomach/Bowel: There is a large amount of stool dilating the rectosigmoid colon with wall thickening and surrounding inflammatory stranding. There is no bowel obstruction, pneumatosis or free air. The appendix is not visualized. Small bowel and stomach are within normal limits. Vascular/Lymphatic: No significant vascular findings are present. No enlarged abdominal or pelvic lymph nodes. Reproductive: Prostate is unremarkable. Other: There small fat containing inguinal  and umbilical hernias. 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 IMPRESSION: 1. Large amount of stool dilating the rectosigmoid colon with wall thickening and surrounding inflammatory stranding compatible with stercoral colitis. 2. Nonobstructing right renal calculus. 3. 6.9 cm hypodense lesion in the right lobe of the liver, likely a cyst. This can be confirmed with ultrasound. 4. 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 Electronically Signed   By: Greig Pique M.D.   On: 05/20/2023 00:19   CT Head Wo  Contrast Result Date: 05/20/2023 CLINICAL DATA:  Mental status change, unknown cause EXAM: CT HEAD WITHOUT CONTRAST TECHNIQUE: Contiguous axial images were obtained from the base of the skull through the vertex without intravenous contrast. RADIATION DOSE REDUCTION: This exam was performed according to the departmental dose-optimization program which includes automated exposure control, adjustment of the mA and/or kV according to patient size and/or use of iterative reconstruction technique. COMPARISON:  CT head August 16, 2017. FINDINGS: Brain: No evidence of acute large vascular territory infarction, hemorrhage, hydrocephalus, extra-axial collection or mass lesion/mass effect. Temporal lobe predominant cerebral atrophy. Vascular: No hyperdense vessel. Skull: No acute fracture. Sinuses/Orbits: Opacified right maxillary sinus with surrounding osteitis, suggestive of chronic sinusitis. No acute orbital finding Other: No mastoid effusions. IMPRESSION: 1. No evidence of acute intracranial abnormality. 2. Temporal lobe predominant cerebral atrophy, which can be seen in Alzheimer's type dementia. 3. Chronic right maxillary sinusitis. Electronically Signed   By: Gilmore GORMAN Molt M.D.   On: 05/20/2023 00:16   DG Chest Port 1 View Result Date: 05/19/2023 CLINICAL DATA:  Sepsis. EXAM: PORTABLE CHEST 1 VIEW COMPARISON:  August 02, 2022.  August 16, 2017. FINDINGS: The heart size and mediastinal contours are within normal limits. Both lungs are clear. Stable elevated left hemidiaphragm. Stable sclerotic density seen in the right scapula. IMPRESSION: No acute abnormality seen. Electronically Signed   By: Lynwood Landy Raddle M.D.   On: 05/19/2023 18:29     TODAY-DAY OF DISCHARGE:  Subjective:   Christopher Hobbs today appears comfortable  Objective:   Blood pressure 134/77, pulse 70, temperature 98.3 F (36.8 C), temperature source Oral, resp. rate 20, height 6' (1.829 m), weight 108.9 kg, SpO2 94%.  Intake/Output Summary  (Last 24 hours) at 05/31/2023 9166 Last data filed at 05/30/2023 1803 Gross per 24 hour  Intake 220 ml  Output 250 ml  Net -30 ml   Filed Weights   05/20/23 0328  Weight: 108.9 kg    Exam: Awake, does not answer questions, in no distress,    [place].AT,PERRAL Supple Neck,No JVD, No cervical lymphadenopathy appriciated.  Symmetrical Chest wall movement, Good air movement bilaterally, CTAB RRR,No Gallops,Rubs or new Murmurs, No Parasternal Heave +ve B.Sounds, Abd Soft, Non tender, No organomegaly appriciated, No rebound -guarding or rigidity. No Cyanosis, Clubbing or edema, No new Rash or bruise   PERTINENT RADIOLOGIC STUDIES: No results found.   PERTINENT LAB RESULTS: CBC: No results for input(s): WBC, HGB, HCT, PLT in the last 72 hours. CMET CMP     Component Value Date/Time   NA 145 05/26/2023 0504   K 3.9 05/26/2023 0504   CL 120 (H) 05/26/2023 0504   CO2 19 (L) 05/26/2023 0504   GLUCOSE 189 (H) 05/26/2023 0504   BUN 13 05/26/2023 0504   CREATININE 1.03 05/26/2023 0504   CALCIUM 8.7 (L) 05/26/2023 0504   PROT 6.7 05/20/2023 0441   ALBUMIN 2.1 (L) 05/21/2023 0536   AST 27 05/20/2023  0441   ALT 18 05/20/2023 0441   ALKPHOS 349 (H) 05/20/2023 0441   BILITOT 0.6 05/20/2023 0441   GFRNONAA >60 05/26/2023 0504    GFR Estimated Creatinine Clearance: 71.7 mL/min (by C-G formula based on SCr of 1.03 mg/dL). No results for input(s): LIPASE, AMYLASE in the last 72 hours. No results for input(s): CKTOTAL, CKMB, CKMBINDEX, TROPONINI in the last 72 hours. Invalid input(s): POCBNP No results for input(s): DDIMER in the last 72 hours. No results for input(s): HGBA1C in the last 72 hours. No results for input(s): CHOL, HDL, LDLCALC, TRIG, CHOLHDL, LDLDIRECT in the last 72 hours. No results for input(s): TSH, T4TOTAL, T3FREE, THYROIDAB in the last 72 hours.  Invalid input(s): FREET3 No results for input(s): VITAMINB12, FOLATE,  FERRITIN, TIBC, IRON, RETICCTPCT in the last 72 hours. Coags: No results for input(s): INR in the last 72 hours.  Invalid input(s): PT Microbiology: Recent Results (from the past 240 hours)  Gastrointestinal Panel by PCR , Stool     Status: None   Collection Time: 05/21/23  6:23 PM   Specimen: STOOL  Result Value Ref Range Status   Campylobacter species NOT DETECTED NOT DETECTED Final   Plesimonas shigelloides NOT DETECTED NOT DETECTED Final   Salmonella species NOT DETECTED NOT DETECTED Final   Yersinia enterocolitica NOT DETECTED NOT DETECTED Final   Vibrio species NOT DETECTED NOT DETECTED Final   Vibrio cholerae NOT DETECTED NOT DETECTED Final   Enteroaggregative E coli (EAEC) NOT DETECTED NOT DETECTED Final   Enteropathogenic E coli (EPEC) NOT DETECTED NOT DETECTED Final   Enterotoxigenic E coli (ETEC) NOT DETECTED NOT DETECTED Final   Shiga like toxin producing E coli (STEC) NOT DETECTED NOT DETECTED Final   Shigella/Enteroinvasive E coli (EIEC) NOT DETECTED NOT DETECTED Final   Cryptosporidium NOT DETECTED NOT DETECTED Final   Cyclospora cayetanensis NOT DETECTED NOT DETECTED Final   Entamoeba histolytica NOT DETECTED NOT DETECTED Final   Giardia lamblia NOT DETECTED NOT DETECTED Final   Adenovirus F40/41 NOT DETECTED NOT DETECTED Final   Astrovirus NOT DETECTED NOT DETECTED Final   Norovirus GI/GII NOT DETECTED NOT DETECTED Final   Rotavirus A NOT DETECTED NOT DETECTED Final   Sapovirus (I, II, IV, and V) NOT DETECTED NOT DETECTED Final    Comment: Performed at [facility], [address] [place], [place] [address]  C Difficile Quick Screen w PCR reflex     Status: None   Collection Time: 05/21/23  6:23 PM   Specimen: STOOL  Result Value Ref Range Status   C Diff antigen NEGATIVE NEGATIVE Final   C Diff toxin NEGATIVE NEGATIVE Final   C Diff interpretation No C. difficile detected.  Final    Comment: Performed at [facility], [address], [place], [place] [address]    FURTHER DISCHARGE INSTRUCTIONS:  Get Medicines reviewed and adjusted: Please take all your medications with you for your next visit with your Primary MD  Laboratory/radiological data: Please request your Primary MD to go over all hospital tests and procedure/radiological results at the follow up, please ask your Primary MD to get all Hospital records sent to his/her office.  In some cases, they will be blood work, cultures and biopsy results pending at the time of your discharge. Please request that your primary care M.D. goes through all the records of your hospital data and follows up on these results.  Signature  -    Lavada Stank M.D on 05/31/2023 at 8:35 AM   -  To page go to [email or web address]

## 2023-05-28 DIAGNOSIS — Z7189 Other specified counseling: Secondary | ICD-10-CM | POA: Diagnosis not present

## 2023-05-28 DIAGNOSIS — N179 Acute kidney failure, unspecified: Secondary | ICD-10-CM | POA: Diagnosis not present

## 2023-05-28 DIAGNOSIS — Z66 Do not resuscitate: Secondary | ICD-10-CM | POA: Diagnosis not present

## 2023-05-28 DIAGNOSIS — Z515 Encounter for palliative care: Secondary | ICD-10-CM | POA: Diagnosis not present

## 2023-05-28 NOTE — Progress Notes (Signed)
 PROGRESS NOTE        PATIENT DETAILS Name: Christopher Hobbs Age: 83 y.o. Sex: male Date of Birth: 27-Sep-1940 Admit Date: 05/19/2023 Admitting Physician Camila DELENA Ned, MD ERE:Yldjpw, Ardell, MD  Brief Summary: Patient is a 83 y.o.  male with history of dementia, seizures-brought in from SNF for acute metabolic encephalopathy in the setting of AKI/hyponatremia due to failure to thrive syndrome/a UTI.  Significant events: 1/30>> admit to [facility] 2/06>>transition to comfort care  Significant studies: 1/30>> CT head: No acute intracranial abnormality 1/30>> CT abdomen/pelvis: Large stool in rectosigmoid-stercoral colitis. 1/31>> EEG: No seizures 2/1>> x-ray abdomen: No SBO. 2/2>> CXR: No obvious PNA  Significant microbiology data: 1/30>> COVID/influenza/RSV PCR: Negative 1/30>> blood culture: No growth 1/31>> urine culture: E. coli (pansensitive)  Procedures: None  Consults: None  Subjective: Sleepy today-discussed with nursing staff-hardly any oral intake at all yesterday-no significant oral intake for the past several days.  Objective: Vitals: Blood pressure 113/82, pulse (!) 109, temperature 98.6 F (37 C), temperature source Axillary, resp. rate 19, height 6' (1.829 m), weight 108.9 kg, SpO2 96%.   Exam: Comfortable-sleeping Not in any distress Easily arouses but then goes back to sleep Generalized weakness-but nonfocal exam.  Pertinent Labs/Radiology:    Latest Ref Rng & Units 05/22/2023    5:26 AM 05/21/2023    5:36 AM 05/20/2023    4:41 AM  CBC  WBC 4.0 - 10.5 K/uL 11.9  12.2  10.5   Hemoglobin 13.0 - 17.0 g/dL 9.6  9.7  9.9   Hematocrit 39.0 - 52.0 % 31.8  33.1  33.9   Platelets 150 - 400 K/uL 174  160  169     Lab Results  Component Value Date   NA 145 05/26/2023   K 3.9 05/26/2023   CL 120 (H) 05/26/2023   CO2 19 (L) 05/26/2023      Assessment/Plan: AKI Hemodynamically mediated in the setting of poor oral  intake/dehydration Resolved with IVF/supportive care-unfortunately continues to have poor oral intake. Off IV fluids since 2/4-renal function stable but sodium slowly trending up.  Suspect that he will redevelop AKI in the near future.  After family meeting with palliative care on 2/6-transitioned to full comfort measures.   Hypernatremia Secondary to poor oral intake-resolved with supportive care including IVF Off all IV fluids since 2/4-oral intake continues to be poor-sodium levels are slowly inching up.  Will likely develop hypernatremia in the near future.   After family meeting with palliative care on 2/6-transitioned to full comfort measures.   Hypokalemia Repleted   Hypomagnesemia Repleted   Hypophosphatemia Repleted   Acute metabolic encephalopathy Due to AKI/hypernatremia CT imaging negative Reviewed prior notes-appears to have very advanced dementia-suspect he will continue to have delirium/encephalopathy when he is hospitalized.   Constipation-fecal impaction-stercoral colitis Ensure bowel regimen Per RN-having BMs-has Flexi-Seal in place. Rocephin /Flagyl  discontinued on 2/4   Complicated UTI Completed Rocephin  x 5 days Unclear why Foley catheter was placed-since he has been transitioned to full comfort measures-plan is to keep Foley catheter in place for comfort.   Normocytic anemia No evidence of blood loss Likely related to chronic disease/IV fluid dilution/acute illness   Dementia Expect delirium-maintain delirium precautions Per prior notes-depending on nursing staff for ADLs-SNF resident   Seizure disorder CT imaging/EEG stable Continue Trileptal /Keppra  being continued for comfort.   Severe failure to thrive syndrome Palliative  care DNR in place Advanced dementia-very poor oral intake-severe failure to thrive syndrome-presented with hypernatremia and AKI-continues to have very poor oral intake during this hospitalization-at risk for recurrent  electrolyte derangement/dehydration-repeat hospitalization.  Palliative care met with family on 2/6-now full comfort measures-plan is to discharge back to SNF with hospice care.  Unfortunately this can only occur early next week-likely Monday   Class 1 Obesity: Estimated body mass index is 32.55 kg/m as calculated from the following:   Height as of this encounter: 6' (1.829 m).   Weight as of this encounter: 108.9 kg.     Class 1 Obesity: Estimated body mass index is 32.55 kg/m as calculated from the following:   Height as of this encounter: 6' (1.829 m).   Weight as of this encounter: 108.9 kg.   Code status:   Code Status: Do not attempt resuscitation (DNR) - Comfort care   DVT Prophylaxis:    Family Communication: POA-Belinda Gill-[phone]-left another voicemail on 2/3.   Disposition Plan: Status is: Inpatient Remains inpatient appropriate because: Severity of illness   Planned Discharge Destination:Skilled nursing facility   Diet: Diet Order             Diet general           DIET - DYS 1 Room service appropriate? No; Fluid consistency: Thin  Diet effective now                     Antimicrobial agents: Anti-infectives (From admission, onward)    Start     Dose/Rate Route Frequency Ordered Stop   05/22/23 1030  metroNIDAZOLE  (FLAGYL ) IVPB 500 mg  Status:  Discontinued        500 mg 100 mL/hr over 60 Minutes Intravenous Every 12 hours 05/22/23 0938 05/24/23 1116   05/21/23 0800  vancomycin  (VANCOREADY) IVPB 750 mg/150 mL  Status:  Discontinued        750 mg 150 mL/hr over 60 Minutes Intravenous Every 24 hours 05/20/23 0623 05/20/23 0625   05/21/23 0800  vancomycin  (VANCOCIN ) 750 mg in sodium chloride  0.9 % 250 mL IVPB  Status:  Discontinued        750 mg 250 mL/hr over 60 Minutes Intravenous Every 24 hours 05/20/23 0625 05/22/23 0938   05/21/23 0600  cefTRIAXone  (ROCEPHIN ) 1 g in sodium chloride  0.9 % 100 mL IVPB  Status:  Discontinued        1  g 200 mL/hr over 30 Minutes Intravenous Every 24 hours 05/20/23 0603 05/24/23 1116   05/20/23 0615  cefTRIAXone  (ROCEPHIN ) 1 g in sodium chloride  0.9 % 100 mL IVPB        1 g 200 mL/hr over 30 Minutes Intravenous  Once 05/20/23 0607 05/20/23 0903   05/20/23 0615  vancomycin  (VANCOREADY) IVPB 2000 mg/400 mL        2,000 mg 200 mL/hr over 120 Minutes Intravenous  Once 05/20/23 0612 05/20/23 0902        MEDICATIONS: Scheduled Meds:  levETIRAcetam   500 mg Oral BID   mouth rinse  15 mL Mouth Rinse 4 times per day   OXcarbazepine   150 mg Oral QHS   Oxcarbazepine   300 mg Oral Daily   Continuous Infusions:   PRN Meds:.acetaminophen  **OR** acetaminophen , antiseptic oral rinse, dextrose , glycopyrrolate  **OR** glycopyrrolate  **OR** glycopyrrolate , haloperidol  **OR** haloperidol  **OR** haloperidol  lactate, LORazepam  **OR** LORazepam  **OR** LORazepam , morphine  injection, morphine  CONCENTRATE, ondansetron  **OR** ondansetron  (ZOFRAN ) IV, mouth rinse, polyvinyl alcohol    I have personally reviewed following labs and  imaging studies  LABORATORY DATA: CBC: Recent Labs  Lab 05/22/23 0526  WBC 11.9*  HGB 9.6*  HCT 31.8*  MCV 90.3  PLT 174    Basic Metabolic Panel: Recent Labs  Lab 05/23/23 0142 05/23/23 0455 05/24/23 0452 05/25/23 0434 05/26/23 0504  NA 147* 146* 144 144 145  K 3.3* 3.1* 3.4* 3.3* 3.9  CL 121* 119* 119* 117* 120*  CO2 18* 19* 18* 19* 19*  GLUCOSE 191* 199* 188* 154* 189*  BUN 24* 23 17 12 13   CREATININE 1.15 1.11 1.17 1.06 1.03  CALCIUM 9.2 9.2 9.0 8.5* 8.7*  MG  --  1.6* 1.9 1.7 2.0  PHOS  --  2.4* 2.5 2.8  --     GFR: Estimated Creatinine Clearance: 71.7 mL/min (by C-G formula based on SCr of 1.03 mg/dL).  Liver Function Tests: No results for input(s): AST, ALT, ALKPHOS, BILITOT, PROT, ALBUMIN in the last 168 hours.  No results for input(s): LIPASE, AMYLASE in the last 168 hours. No results for input(s): AMMONIA in the last 168  hours.  Coagulation Profile: No results for input(s): INR, PROTIME in the last 168 hours.   Cardiac Enzymes: No results for input(s): CKTOTAL, CKMB, CKMBINDEX, TROPONINI in the last 168 hours.   BNP (last 3 results) No results for input(s): PROBNP in the last 8760 hours.  Lipid Profile: No results for input(s): CHOL, HDL, LDLCALC, TRIG, CHOLHDL, LDLDIRECT in the last 72 hours.  Thyroid Function Tests: No results for input(s): TSH, T4TOTAL, FREET4, T3FREE, THYROIDAB in the last 72 hours.   Anemia Panel: No results for input(s): VITAMINB12, FOLATE, FERRITIN, TIBC, IRON, RETICCTPCT in the last 72 hours.  Urine analysis:    Component Value Date/Time   COLORURINE AMBER (A) 05/20/2023 0400   APPEARANCEUR HAZY (A) 05/20/2023 0400   LABSPEC 1.017 05/20/2023 0400   PHURINE 5.0 05/20/2023 0400   GLUCOSEU NEGATIVE 05/20/2023 0400   HGBUR LARGE (A) 05/20/2023 0400   BILIRUBINUR NEGATIVE 05/20/2023 0400   KETONESUR NEGATIVE 05/20/2023 0400   PROTEINUR NEGATIVE 05/20/2023 0400   NITRITE NEGATIVE 05/20/2023 0400   LEUKOCYTESUR NEGATIVE 05/20/2023 0400    Sepsis Labs: Lactic Acid, Venous    Component Value Date/Time   LATICACIDVEN 1.5 05/21/2023 0223    MICROBIOLOGY: Recent Results (from the past 240 hours)  Blood Culture (routine x 2)     Status: None   Collection Time: 05/19/23  8:40 PM   Specimen: BLOOD  Result Value Ref Range Status   Specimen Description BLOOD RIGHT ANTECUBITAL  Final   Special Requests   Final    BOTTLES DRAWN AEROBIC ONLY Blood Culture results may not be optimal due to an excessive volume of blood received in culture bottles   Culture   Final    NO GROWTH 5 DAYS Performed at [facility], [address], [place], [place] [address]    Report Status 05/24/2023 FINAL  Final  Blood Culture (routine x 2)     Status: None   Collection Time: 05/19/23  8:44 PM   Specimen: BLOOD RIGHT HAND  Result  Value Ref Range Status   Specimen Description BLOOD RIGHT HAND  Final   Special Requests   Final    BOTTLES DRAWN AEROBIC AND ANAEROBIC Blood Culture results may not be optimal due to an inadequate volume of blood received in culture bottles   Culture   Final    NO GROWTH 5 DAYS Performed at [facility], [address] [place],   [address]    Report Status 05/25/2023 FINAL  Final  Resp panel by RT-PCR (RSV, Flu A&B, Covid) Anterior Nasal Swab     Status: None   Collection Time: 05/19/23 10:30 PM   Specimen: Anterior Nasal Swab  Result Value Ref Range Status   SARS Coronavirus 2 by RT PCR NEGATIVE NEGATIVE Final   Influenza A by PCR NEGATIVE NEGATIVE Final   Influenza B by PCR NEGATIVE NEGATIVE Final    Comment: (NOTE) The Xpert Xpress SARS-CoV-2/FLU/RSV plus assay is intended as an aid in the diagnosis of influenza from Nasopharyngeal swab specimens and should not be used as a sole basis for treatment. Nasal washings and aspirates are unacceptable for Xpert Xpress SARS-CoV-2/FLU/RSV testing.  Fact Sheet for Patients: bloggercourse.com  Fact Sheet for Healthcare Providers: seriousbroker.it  This test is not yet approved or cleared by the [place]  FDA and has been authorized for detection and/or diagnosis of SARS-CoV-2 by FDA under an Emergency Use Authorization (EUA). This EUA will remain in effect (meaning this test can be used) for the duration of the COVID-19 declaration under Section 564(b)(1) of the Act, 21 U.S.C. section 360bbb-3(b)(1), unless the authorization is terminated or revoked.     Resp Syncytial Virus by PCR NEGATIVE NEGATIVE Final    Comment: (NOTE) Fact Sheet for Patients: bloggercourse.com  Fact Sheet for Healthcare Providers: seriousbroker.it  This test is not yet approved or cleared by the [place]  FDA and has been authorized for  detection and/or diagnosis of SARS-CoV-2 by FDA under an Emergency Use Authorization (EUA). This EUA will remain in effect (meaning this test can be used) for the duration of the COVID-19 declaration under Section 564(b)(1) of the Act, 21 U.S.C. section 360bbb-3(b)(1), unless the authorization is terminated or revoked.  Performed at [facility], [address], [place], [place] [address]   Urine Culture     Status: Abnormal   Collection Time: 05/20/23  4:00 AM   Specimen: Urine, Random  Result Value Ref Range Status   Specimen Description URINE, RANDOM  Final   Special Requests   Final    NONE Reflexed from [id number] Performed at [facility], [address], [place], [place] [address]    Culture >=100,000 COLONIES/mL ESCHERICHIA COLI (A)  Final   Report Status 05/22/2023 FINAL  Final   Organism ID, Bacteria ESCHERICHIA COLI (A)  Final      Susceptibility   Escherichia coli - MIC*    AMPICILLIN 8 SENSITIVE Sensitive     CEFAZOLIN <=4 SENSITIVE Sensitive     CEFEPIME <=0.12 SENSITIVE Sensitive     CEFTRIAXONE  <=0.25 SENSITIVE Sensitive     CIPROFLOXACIN <=0.25 SENSITIVE Sensitive     GENTAMICIN <=1 SENSITIVE Sensitive     IMIPENEM <=0.25 SENSITIVE Sensitive     NITROFURANTOIN <=16 SENSITIVE Sensitive     TRIMETH/SULFA <=20 SENSITIVE Sensitive     AMPICILLIN/SULBACTAM <=2 SENSITIVE Sensitive     PIP/TAZO <=4 SENSITIVE Sensitive ug/mL    * >=100,000 COLONIES/mL ESCHERICHIA COLI  Gastrointestinal Panel by PCR , Stool     Status: None   Collection Time: 05/21/23  6:23 PM   Specimen: STOOL  Result Value Ref Range Status   Campylobacter species NOT DETECTED NOT DETECTED Final   Plesimonas shigelloides NOT DETECTED NOT DETECTED Final   Salmonella species NOT DETECTED NOT DETECTED Final   Yersinia enterocolitica NOT DETECTED NOT DETECTED Final   Vibrio species NOT DETECTED NOT DETECTED Final   Vibrio cholerae NOT DETECTED NOT  DETECTED Final   Enteroaggregative E coli  (EAEC) NOT DETECTED NOT DETECTED Final   Enteropathogenic E coli (EPEC) NOT DETECTED NOT DETECTED Final   Enterotoxigenic E coli (ETEC) NOT DETECTED NOT DETECTED Final   Shiga like toxin producing E coli (STEC) NOT DETECTED NOT DETECTED Final   Shigella/Enteroinvasive E coli (EIEC) NOT DETECTED NOT DETECTED Final   Cryptosporidium NOT DETECTED NOT DETECTED Final   Cyclospora cayetanensis NOT DETECTED NOT DETECTED Final   Entamoeba histolytica NOT DETECTED NOT DETECTED Final   Giardia lamblia NOT DETECTED NOT DETECTED Final   Adenovirus F40/41 NOT DETECTED NOT DETECTED Final   Astrovirus NOT DETECTED NOT DETECTED Final   Norovirus GI/GII NOT DETECTED NOT DETECTED Final   Rotavirus A NOT DETECTED NOT DETECTED Final   Sapovirus (I, II, IV, and V) NOT DETECTED NOT DETECTED Final    Comment: Performed at [facility], [address] [place], [place] [address]  C Difficile Quick Screen w PCR reflex     Status: None   Collection Time: 05/21/23  6:23 PM   Specimen: STOOL  Result Value Ref Range Status   C Diff antigen NEGATIVE NEGATIVE Final   C Diff toxin NEGATIVE NEGATIVE Final   C Diff interpretation No C. difficile detected.  Final    Comment: Performed at [facility], [address], [place], [place] [address]    RADIOLOGY STUDIES/RESULTS: No results found.    LOS: 8 days   Donalda Applebaum, MD  [facility]    To contact the attending provider between 7A-7P or the covering provider during after hours 7P-7A, please log into the web site [email or web address] and access using universal [place] password for that web site. If you do not have the password, please call the hospital operator.  05/28/2023, 11:51 AM

## 2023-05-28 NOTE — Progress Notes (Signed)
 Daily Progress Note   Patient Name: Ethanjames Fontenot       Date: 05/28/2023 DOB: October 27, 1940  Age: 83 y.o. MRN#: [id number] Attending Physician: Raenelle Donalda HERO, MD Primary Care Physician: Ransom Other, MD Admit Date: 05/19/2023 Length of Stay: 8 days  Reason for Consultation/Follow-up: Establishing goals of care  HPI/Patient Profile:  83 y.o. male with past medical history of dementia and seizure presented to ED from facility with AMS and hypotension. Patient was admitted on 05/19/2023 with severe dehydration leading to AKI, sepsis, hypernatremia/hypokalemia, and acute metabolic encephalopathy.   Subjective:   Subjective: Chart Reviewed. Updates received. Patient Assessed. Created space and opportunity for patient  and family to explore thoughts and feelings regarding current medical situation.  Today's Discussion: Today saw the patient bedside, no family was present.  The patient appeared comfortable, was sleeping peacefully and I elected not to wake him.  His meal tray was at the bedside table, untouched.  I spoke with the charge nurse to ask if somebody can attempt to feed him to see if he is indeed eating or not.  This helps us  determine eligibility for placement.  After saw the patient I called and spoke with his niece Shayna.  I updated Chane on the status of the patient, informed her that the charge nurse was attempting to feed him.  When I went he was sleeping and looked peaceful.  I reinforced that we can continue to try to feed him but will not force him to eat.  Hilma is daughter visited yesterday and when she played music within the patient's legs he woke up.  They do see that he is becoming more sleepy and somnolent.  I updated them on the status of doing a facility.  They were told that potentially this weekend, hopefully Monday at the latest.  I told him we would continue to see the patient while he is admitted.  I provided emotional and general support to the patient through  reassurance and therapeutic touch and to family through therapeutic listening, empathy, and other techniques. I answered all questions and addressed all concerns to the best of my ability.  Review of Systems  Unable to perform ROS   Objective:   Vital Signs:  BP (!) 74/63 (BP Location: Right Arm)   Pulse (!) 57   Temp 98.6 F (37 C) (Axillary)   Resp 19   Ht 6' (1.829 m)   Wt 108.9 kg   SpO2 100%   BMI 32.55 kg/m   Physical Exam Vitals and nursing note reviewed.  Constitutional:      General: He is sleeping. He is not in acute distress.    Appearance: He is ill-appearing.  HENT:     Head: Normocephalic and atraumatic.  Cardiovascular:     Rate and Rhythm: Normal rate.  Pulmonary:     Effort: Pulmonary effort is normal. No respiratory distress.  Abdominal:     General: Abdomen is flat. There is no distension.  Skin:    General: Skin is warm and dry.     Palliative Assessment/Data: 20-30%    Existing Vynca/ACP Documentation: None  Assessment & Plan:   Impression: Present on Admission:  AKI (acute kidney injury) (HCC)  83 year old male with acute presentation chronic comorbidities as described above.  The patient is approaching end-of-life, has had a more rapid acceleration of his dementia.  He is not eating and drinking enough to sustain himself, although he does occasionally eat if he is hand fed.  Family understands  the situation and they agree that he is approaching end-of-life.  We have elected to transition to comfort care and engaged with hospice services at his long-term care facility [facility].  Appears that [facility] has accepted patient back with hospice in place, pending final signing of one-time contract by [facility] and family signing of paperwork by family. We will continue to help coordinate this and work with them to obtain the best care possible. [facility] unable to sign paperwork until Monday.  Overall prognosis poor.  SUMMARY OF  RECOMMENDATIONS   DNR-comfort Continue comfort care See symptom management orders below Continued emotional support of patient and family Anticipate d/c to [facility] with [facility] in plac Palliative medicine will continue support for comfort care  Symptom Management:  Tylenol  650 mg PR every 6 hours as needed mild pain or fever Robinul  0.2 mg IV every 4 hours as needed excessive secretions Haldol  0.5 mg IV every 4 hours as needed agitation or delirium Ativan  1 mg IV every 4 hours as needed anxiety CONTINUE Keppra  500 mg oral twice daily for seizure prophylaxis Morphine  1 to 4 mg IV every 15 minutes as needed severe pain or signs of pain/distress Zofran  4 mg IV every 6 hours as needed nausea Polyvinyl alcohol  1.4% ophthalmic 1 drop OU 4 times daily as needed dry eyes  Code Status: DNR-comfort  Prognosis: < 6 months  Discharge Planning: Skilled Nursing Facility with Hospice  Discussed with: Patient's family, medical team, nursing team  Thank you for allowing us  to participate in the care of Jasen Hartstein PMT will continue to support holistically.  Time Total: 39 min  Detailed review of medical records (labs, imaging, vital signs), medically appropriate exam, discussed with treatment team, counseling and education to patient, family, & staff, documenting clinical information, medication management, coordination of care  Camellia Kays, NP Palliative Medicine Team  Team Phone # [phone] (Nights/Weekends)  12/16/2020, 8:17 AM

## 2023-05-29 DIAGNOSIS — N179 Acute kidney failure, unspecified: Secondary | ICD-10-CM | POA: Diagnosis not present

## 2023-05-29 DIAGNOSIS — K5289 Other specified noninfective gastroenteritis and colitis: Secondary | ICD-10-CM | POA: Diagnosis not present

## 2023-05-29 DIAGNOSIS — E860 Dehydration: Secondary | ICD-10-CM | POA: Diagnosis not present

## 2023-05-29 DIAGNOSIS — Z515 Encounter for palliative care: Secondary | ICD-10-CM | POA: Diagnosis not present

## 2023-05-29 NOTE — Progress Notes (Signed)
 Daily Progress Note   Patient Name: Christopher Hobbs       Date: 05/29/2023 DOB: 08-13-1940  Age: 83 y.o. MRN#: [id number] Attending Physician: Dennise Lavada POUR, MD Primary Care Physician: Christopher Other, MD Admit Date: 05/19/2023  Reason for Consultation/Follow-up: Pain control, Psychosocial/spiritual support, and Terminal Care  Subjective: I have reviewed medical records including EPIC notes, MAR, any available advanced directives as necessary, and labs. Received report from primary RN - no acute concerns. RN reports patient with minimal oral intake.  Went to visit patient at bedside - no family/visitors present. Patient was lying in bed with his eyes open, but is non-interactive and does not acknowledge my presence. No signs or non-verbal gestures of pain or discomfort noted. No respiratory distress, increased work of breathing, or secretions noted. Mitts in use. He is on 1L O2 [place].  Called [place] - emotional support provided. Therapeutic listening provided as she reflects on discussions with previous PMT provider. She confirms goals for patient's return to [facility] with hospice once the facility MD signs paperwork for his return. Family visited with patient yesterday - they tried to feed him but patient was not accepting of solids, only sips of liquid. Family enjoy playing patient's favorite music for him.  [name] expresses appreciation for PMT assistance.  All questions and concerns addressed. Encouraged to call with questions and/or concerns. PMT number previously provided.  Requested RN remove mitts and utilization of ativan  for any restless.  Length of Stay: 9  Current Medications: Scheduled Meds:   levETIRAcetam   500 mg Oral BID   mouth rinse  15 mL Mouth Rinse 4 times per day    OXcarbazepine   150 mg Oral QHS   Oxcarbazepine   300 mg Oral Daily    Continuous Infusions:   PRN Meds: acetaminophen  **OR** acetaminophen , antiseptic oral rinse, dextrose , glycopyrrolate  **OR** glycopyrrolate  **OR** glycopyrrolate , haloperidol  **OR** haloperidol  **OR** haloperidol  lactate, LORazepam  **OR** LORazepam  **OR** LORazepam , morphine  injection, morphine  CONCENTRATE, ondansetron  **OR** ondansetron  (ZOFRAN ) IV, mouth rinse, polyvinyl alcohol   Physical Exam Vitals and nursing note reviewed.  Constitutional:      General: He is not in acute distress.    Appearance: He is ill-appearing.  Pulmonary:     Effort: No respiratory distress.  Skin:    General: Skin is warm and dry.  Neurological:     Motor: Weakness  present.             Vital Signs: BP 120/63   Pulse (!) 57   Temp 98.2 F (36.8 C) (Oral)   Resp 19   Ht 6' (1.829 m)   Wt 108.9 kg   SpO2 96%   BMI 32.55 kg/m  SpO2: SpO2: 96 % O2 Device: O2 Device: Nasal Cannula O2 Flow Rate:    Intake/output summary:  Intake/Output Summary (Last 24 hours) at 05/29/2023 9076 Last data filed at 05/28/2023 1400 Gross per 24 hour  Intake 120 ml  Output 350 ml  Net -230 ml   LBM: Last BM Date : 05/28/23 Baseline Weight: Weight: 108.9 kg Most recent weight: Weight: 108.9 kg      Palliative Assessment/Data: PPS 20%      Patient Active Problem List   Diagnosis Date Noted   AKI (acute kidney injury) (HCC) 05/20/2023    Palliative Care Assessment & Plan   Patient Profile: 83 y.o. male with past medical history of dementia and seizure presented to ED from facility with AMS and hypotension. Patient was admitted on 05/19/2023 with severe dehydration leading to AKI, sepsis, hypernatremia/hypokalemia, and acute metabolic encephalopathy.   Assessment: Principal Problem:   AKI (acute kidney injury) (HCC)   Terminal care  Recommendations/Plan: Continue full comfort measures Continue DNR/DNI as previously documented Plan  for discharge back to [facility] with hosp[facility] - likely Monday 2/10 Continue current comfort focused medication regimen - no changes Added orders for EOL symptom management and to reflect full comfort measures, as well as discontinued orders that were not focused on comfort  PMT will continue to follow and support holistically  Symptom Management Morphine  PO/IV PRN  pain/dyspnea/increased work of breathing/RR>25 Tylenol  PRN pain/fever Biotin PRN dry mouth Robinul  PRN secretions Haldol  PRN agitation/delirium Ativan  PRN anxiety/seizure/sleep/distress Zofran  PRN nausea/vomiting Liquifilm Tears PRN dry eye Continue Keppra  and trileptal  while tolerating POs   Goals of Care and Additional Recommendations: Limitations on Scope of Treatment: Full Comfort Care  Code Status:    Code Status Orders  (From admission, onward)           Start     Ordered   05/26/23 1032  Do not attempt resuscitation (DNR) - Comfort care  Continuous       Question Answer Comment  If patient has no pulse and is not breathing Do Not Attempt Resuscitation   In Pre-Arrest Conditions (Patient Is Breathing and Has a Pulse) Provide comfort measures. Relieve any mechanical airway obstruction. Avoid transfer unless required for comfort.   Consent: Discussion documented in EHR or advanced directives reviewed      05/26/23 1031           Code Status History     Date Active Date Inactive Code Status Order ID Comments User Context   05/20/2023 0604 05/26/2023 1031 Limited: Do not attempt resuscitation (DNR) -DNR-LIMITED -Do Not Intubate/DNI  [id number]  Debby Camila LABOR, MD ED   05/20/2023 0332 05/20/2023 0604 Full Code [id number]  Debby Camila LABOR, MD ED      Advance Directive Documentation    Flowsheet Row Most Recent Value  Type of Advance Directive Healthcare Power of Attorney, Living will  Pre-existing out of facility DNR order (yellow form or pink MOST form) --  MOST Form in Place? --        Prognosis:  < 2 weeks  Discharge Planning: Skilled Nursing Facility with Hospice  Care plan was discussed with primary RN, patient's family  Thank  you for allowing the Palliative Medicine Team to assist in the care of this patient.     [name], NP  Please contact Palliative Medicine Team phone at [phone] for questions and concerns.   *Portions of this note are a verbal dictation therefore any spelling and/or grammatical errors are due to the Dragon Medical One system interpretation.

## 2023-05-29 NOTE — Progress Notes (Signed)
 PROGRESS NOTE        PATIENT DETAILS Name: Christopher Hobbs Age: 83 y.o. Sex: male Date of Birth: 1940/05/15 Admit Date: 05/19/2023 Admitting Physician Camila DELENA Ned, MD ERE:Yldjpw, Ardell, MD  Brief Summary: Patient is a 83 y.o.  male with history of dementia, seizures-brought in from SNF for acute metabolic encephalopathy in the setting of AKI/hyponatremia due to failure to thrive syndrome/a UTI.  He has been transition to full comfort care on 05/26/2023, transferred to my care on 05/29/2023.  If survives more than 1 to 2 days transferred to residential hospice.  Significant events: 1/30>> admit to [facility] 2/06>>transition to comfort care  Significant studies: 1/30>> CT head: No acute intracranial abnormality 1/30>> CT abdomen/pelvis: Large stool in rectosigmoid-stercoral colitis. 1/31>> EEG: No seizures 2/1>> x-ray abdomen: No SBO. 2/2>> CXR: No obvious PNA  Significant microbiology data: 1/30>> COVID/influenza/RSV PCR: Negative 1/30>> blood culture: No growth 1/31>> urine culture: E. coli (pansensitive)  Procedures: None  Consults: None  Subjective: In bed somnolent unable to answer questions but appears to be in no distress now under full comfort care  Objective: Vitals: Blood pressure 120/63, pulse (!) 57, temperature 98.2 F (36.8 C), temperature source Oral, resp. rate 19, height 6' (1.829 m), weight 108.9 kg, SpO2 96%.   Exam: Comfortable-sleeping Not in any distress Easily arouses but then goes back to sleep Generalized weakness-but nonfocal exam.  Pertinent Labs/Radiology:    Latest Ref Rng & Units 05/22/2023    5:26 AM 05/21/2023    5:36 AM 05/20/2023    4:41 AM  CBC  WBC 4.0 - 10.5 K/uL 11.9  12.2  10.5   Hemoglobin 13.0 - 17.0 g/dL 9.6  9.7  9.9   Hematocrit 39.0 - 52.0 % 31.8  33.1  33.9   Platelets 150 - 400 K/uL 174  160  169     Lab Results  Component Value Date   NA 145 05/26/2023   K 3.9 05/26/2023   CL 120 (H)  05/26/2023   CO2 19 (L) 05/26/2023      Assessment/Plan: AKI Hemodynamically mediated in the setting of poor oral intake/dehydration Resolved with IVF/supportive care-unfortunately continues to have poor oral intake. Off IV fluids since 2/4-renal function stable but sodium slowly trending up.  Suspect that he will redevelop AKI in the near future.  After family meeting with palliative care on 2/6-transitioned to full comfort measures.   Hypernatremia Secondary to poor oral intake-resolved with supportive care including IVF Off all IV fluids since 2/4-oral intake continues to be poor-sodium levels are slowly inching up.  Will likely develop hypernatremia in the near future.   After family meeting with palliative care on 2/6-transitioned to full comfort measures.   Hypokalemia Repleted   Hypomagnesemia Repleted   Hypophosphatemia Repleted   Acute metabolic encephalopathy Due to AKI/hypernatremia CT imaging negative Reviewed prior notes-appears to have very advanced dementia-suspect he will continue to have delirium/encephalopathy when he is hospitalized.   Constipation-fecal impaction-stercoral colitis Ensure bowel regimen Per RN-having BMs-has Flexi-Seal in place. Rocephin /Flagyl  discontinued on 2/4   Complicated UTI Completed Rocephin  x 5 days Unclear why Foley catheter was placed-since he has been transitioned to full comfort measures-plan is to keep Foley catheter in place for comfort.   Normocytic anemia No evidence of blood loss Likely related to chronic disease/IV fluid dilution/acute illness   Dementia Expect delirium-maintain delirium precautions  Per prior notes-depending on nursing staff for ADLs-SNF resident   Seizure disorder CT imaging/EEG stable Continue Trileptal /Keppra  being continued for comfort.   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  Palliative care met with family on 2/6-now full comfort measures-plan is to discharge back to SNF with hospice care.  Unfortunately this can only occur early next week-likely Monday   Class 1 Obesity: Estimated body mass index is 32.55 kg/m as calculated from the following:   Height as of this encounter: 6' (1.829 m).   Weight as of this encounter: 108.9 kg.     Class 1 Obesity: Estimated body mass index is 32.55 kg/m as calculated from the following:   Height as of this encounter: 6' (1.829 m).   Weight as of this encounter: 108.9 kg.   Code status:   Code Status: Do not attempt resuscitation (DNR) - Comfort care   DVT Prophylaxis:    Family Communication: POA-Belinda [phone]-left another voicemail on 2/3.   Disposition Plan: Status is: Inpatient Remains inpatient appropriate because: Severity of illness   Planned Discharge Destination:Skilled nursing facility   Diet: Diet Order             Diet general           DIET - DYS 1 Room service appropriate? No; Fluid consistency: Thin  Diet effective now                     Antimicrobial agents: Anti-infectives (From admission, onward)    Start     Dose/Rate Route Frequency Ordered Stop   05/22/23 1030  metroNIDAZOLE  (FLAGYL ) IVPB 500 mg  Status:  Discontinued        500 mg 100 mL/hr over 60 Minutes Intravenous Every 12 hours 05/22/23 0938 05/24/23 1116   05/21/23 0800  vancomycin  (VANCOREADY) IVPB 750 mg/150 mL  Status:  Discontinued        750 mg 150 mL/hr over 60 Minutes Intravenous Every 24 hours 05/20/23 0623 05/20/23 0625   05/21/23 0800  vancomycin  (VANCOCIN ) 750 mg in sodium chloride  0.9 % 250 mL IVPB  Status:  Discontinued        750 mg 250 mL/hr over 60 Minutes Intravenous Every 24 hours 05/20/23 0625 05/22/23 0938    05/21/23 0600  cefTRIAXone  (ROCEPHIN ) 1 g in sodium chloride  0.9 % 100 mL IVPB  Status:  Discontinued        1 g 200 mL/hr over 30 Minutes Intravenous Every 24 hours 05/20/23 0603 05/24/23 1116   05/20/23 0615  cefTRIAXone  (ROCEPHIN ) 1 g in sodium chloride  0.9 % 100 mL IVPB        1 g 200 mL/hr over 30 Minutes Intravenous  Once 05/20/23 0607 05/20/23 0903   05/20/23 0615  vancomycin  (VANCOREADY) IVPB 2000 mg/400 mL        2,000 mg 200 mL/hr over 120 Minutes Intravenous  Once 05/20/23 0612 05/20/23 0902        MEDICATIONS: Scheduled Meds:  levETIRAcetam   500 mg Oral BID   mouth rinse  15 mL Mouth Rinse 4 times per day   OXcarbazepine   150 mg Oral QHS   Oxcarbazepine   300 mg Oral Daily   Continuous Infusions:   PRN Meds:.acetaminophen  **OR** acetaminophen , antiseptic oral rinse, dextrose , glycopyrrolate  **OR** glycopyrrolate  **OR** glycopyrrolate , haloperidol  **OR** haloperidol  **  OR** haloperidol  lactate, LORazepam  **OR** LORazepam  **OR** LORazepam , morphine  injection, morphine  CONCENTRATE, ondansetron  **OR** ondansetron  (ZOFRAN ) IV, mouth rinse, polyvinyl alcohol    I have personally reviewed following labs and imaging studies  LABORATORY DATA: CBC: No results for input(s): WBC, NEUTROABS, HGB, HCT, MCV, PLT in the last 168 hours.   Basic Metabolic Panel: Recent Labs  Lab 05/23/23 0142 05/23/23 0455 05/24/23 0452 05/25/23 0434 05/26/23 0504  NA 147* 146* 144 144 145  K 3.3* 3.1* 3.4* 3.3* 3.9  CL 121* 119* 119* 117* 120*  CO2 18* 19* 18* 19* 19*  GLUCOSE 191* 199* 188* 154* 189*  BUN 24* 23 17 12 13   CREATININE 1.15 1.11 1.17 1.06 1.03  CALCIUM 9.2 9.2 9.0 8.5* 8.7*  MG  --  1.6* 1.9 1.7 2.0  PHOS  --  2.4* 2.5 2.8  --     GFR: Estimated Creatinine Clearance: 71.7 mL/min (by C-G formula based on SCr of 1.03 mg/dL).  Liver Function Tests: No results for input(s): AST, ALT, ALKPHOS, BILITOT, PROT, ALBUMIN in the last 168 hours.  No  results for input(s): LIPASE, AMYLASE in the last 168 hours. No results for input(s): AMMONIA in the last 168 hours.  Coagulation Profile: No results for input(s): INR, PROTIME in the last 168 hours.   Cardiac Enzymes: No results for input(s): CKTOTAL, CKMB, CKMBINDEX, TROPONINI in the last 168 hours.   BNP (last 3 results) No results for input(s): PROBNP in the last 8760 hours.  Lipid Profile: No results for input(s): CHOL, HDL, LDLCALC, TRIG, CHOLHDL, LDLDIRECT in the last 72 hours.  Thyroid Function Tests: No results for input(s): TSH, T4TOTAL, FREET4, T3FREE, THYROIDAB in the last 72 hours.   Anemia Panel: No results for input(s): VITAMINB12, FOLATE, FERRITIN, TIBC, IRON, RETICCTPCT in the last 72 hours.  Urine analysis:    Component Value Date/Time   COLORURINE AMBER (A) 05/20/2023 0400   APPEARANCEUR HAZY (A) 05/20/2023 0400   LABSPEC 1.017 05/20/2023 0400   PHURINE 5.0 05/20/2023 0400   GLUCOSEU NEGATIVE 05/20/2023 0400   HGBUR LARGE (A) 05/20/2023 0400   BILIRUBINUR NEGATIVE 05/20/2023 0400   KETONESUR NEGATIVE 05/20/2023 0400   PROTEINUR NEGATIVE 05/20/2023 0400   NITRITE NEGATIVE 05/20/2023 0400   LEUKOCYTESUR NEGATIVE 05/20/2023 0400    Sepsis Labs: Lactic Acid, Venous    Component Value Date/Time   LATICACIDVEN 1.5 05/21/2023 0223    MICROBIOLOGY: Recent Results (from the past 240 hours)  Blood Culture (routine x 2)     Status: None   Collection Time: 05/19/23  8:40 PM   Specimen: BLOOD  Result Value Ref Range Status   Specimen Description BLOOD RIGHT ANTECUBITAL  Final   Special Requests   Final    BOTTLES DRAWN AEROBIC ONLY Blood Culture results may not be optimal due to an excessive volume of blood received in culture bottles   Culture   Final    NO GROWTH 5 DAYS Performed at [facility], [address] [place], [place] [address]    Report Status 05/24/2023 FINAL  Final  Blood  Culture (routine x 2)     Status: None   Collection Time: 05/19/23  8:44 PM   Specimen: BLOOD RIGHT HAND  Result Value Ref Range Status   Specimen Description BLOOD RIGHT HAND  Final   Special Requests   Final    BOTTLES DRAWN AEROBIC AND ANAEROBIC Blood Culture results may not be optimal due to an inadequate volume of blood received in culture bottles  Culture   Final    NO GROWTH 5 DAYS Performed at [facility], [address], [place], [place] [address]    Report Status 05/25/2023 FINAL  Final  Resp panel by RT-PCR (RSV, Flu A&B, Covid) Anterior Nasal Swab     Status: None   Collection Time: 05/19/23 10:30 PM   Specimen: Anterior Nasal Swab  Result Value Ref Range Status   SARS Coronavirus 2 by RT PCR NEGATIVE NEGATIVE Final   Influenza A by PCR NEGATIVE NEGATIVE Final   Influenza B by PCR NEGATIVE NEGATIVE Final    Comment: (NOTE) The Xpert Xpress SARS-CoV-2/FLU/RSV plus assay is intended as an aid in the diagnosis of influenza from Nasopharyngeal swab specimens and should not be used as a sole basis for treatment. Nasal washings and aspirates are unacceptable for Xpert Xpress SARS-CoV-2/FLU/RSV testing.  Fact Sheet for Patients: bloggercourse.com  Fact Sheet for Healthcare Providers: seriousbroker.it  This test is not yet approved or cleared by the [place]  FDA and has been authorized for detection and/or diagnosis of SARS-CoV-2 by FDA under an Emergency Use Authorization (EUA). This EUA will remain in effect (meaning this test can be used) for the duration of the COVID-19 declaration under Section 564(b)(1) of the Act, 21 U.S.C. section 360bbb-3(b)(1), unless the authorization is terminated or revoked.     Resp Syncytial Virus by PCR NEGATIVE NEGATIVE Final    Comment: (NOTE) Fact Sheet for Patients: bloggercourse.com  Fact Sheet for Healthcare  Providers: seriousbroker.it  This test is not yet approved or cleared by the [place]  FDA and has been authorized for detection and/or diagnosis of SARS-CoV-2 by FDA under an Emergency Use Authorization (EUA). This EUA will remain in effect (meaning this test can be used) for the duration of the COVID-19 declaration under Section 564(b)(1) of the Act, 21 U.S.C. section 360bbb-3(b)(1), unless the authorization is terminated or revoked.  Performed at [facility], [address] [place], [place] [address]   Urine Culture     Status: Abnormal   Collection Time: 05/20/23  4:00 AM   Specimen: Urine, Random  Result Value Ref Range Status   Specimen Description URINE, RANDOM  Final   Special Requests   Final    NONE Reflexed from [id number] Performed at [facility], [address] [place], [place] [address]    Culture >=100,000 COLONIES/mL ESCHERICHIA COLI (A)  Final   Report Status 05/22/2023 FINAL  Final   Organism ID, Bacteria ESCHERICHIA COLI (A)  Final      Susceptibility   Escherichia coli - MIC*    AMPICILLIN 8 SENSITIVE Sensitive     CEFAZOLIN <=4 SENSITIVE Sensitive     CEFEPIME <=0.12 SENSITIVE Sensitive     CEFTRIAXONE  <=0.25 SENSITIVE Sensitive     CIPROFLOXACIN <=0.25 SENSITIVE Sensitive     GENTAMICIN <=1 SENSITIVE Sensitive     IMIPENEM <=0.25 SENSITIVE Sensitive     NITROFURANTOIN <=16 SENSITIVE Sensitive     TRIMETH/SULFA <=20 SENSITIVE Sensitive     AMPICILLIN/SULBACTAM <=2 SENSITIVE Sensitive     PIP/TAZO <=4 SENSITIVE Sensitive ug/mL    * >=100,000 COLONIES/mL ESCHERICHIA COLI  Gastrointestinal Panel by PCR , Stool     Status: None   Collection Time: 05/21/23  6:23 PM   Specimen: STOOL  Result Value Ref Range Status   Campylobacter species NOT DETECTED NOT DETECTED Final   Plesimonas shigelloides NOT DETECTED NOT DETECTED Final   Salmonella species NOT DETECTED NOT DETECTED Final  Yersinia enterocolitica NOT  DETECTED NOT DETECTED Final   Vibrio species NOT DETECTED NOT DETECTED Final   Vibrio cholerae NOT DETECTED NOT DETECTED Final   Enteroaggregative E coli (EAEC) NOT DETECTED NOT DETECTED Final   Enteropathogenic E coli (EPEC) NOT DETECTED NOT DETECTED Final   Enterotoxigenic E coli (ETEC) NOT DETECTED NOT DETECTED Final   Shiga like toxin producing E coli (STEC) NOT DETECTED NOT DETECTED Final   Shigella/Enteroinvasive E coli (EIEC) NOT DETECTED NOT DETECTED Final   Cryptosporidium NOT DETECTED NOT DETECTED Final   Cyclospora cayetanensis NOT DETECTED NOT DETECTED Final   Entamoeba histolytica NOT DETECTED NOT DETECTED Final   Giardia lamblia NOT DETECTED NOT DETECTED Final   Adenovirus F40/41 NOT DETECTED NOT DETECTED Final   Astrovirus NOT DETECTED NOT DETECTED Final   Norovirus GI/GII NOT DETECTED NOT DETECTED Final   Rotavirus A NOT DETECTED NOT DETECTED Final   Sapovirus (I, II, IV, and V) NOT DETECTED NOT DETECTED Final    Comment: Performed at [facility], [address] [place], [place] [address]  C Difficile Quick Screen w PCR reflex     Status: None   Collection Time: 05/21/23  6:23 PM   Specimen: STOOL  Result Value Ref Range Status   C Diff antigen NEGATIVE NEGATIVE Final   C Diff toxin NEGATIVE NEGATIVE Final   C Diff interpretation No C. difficile detected.  Final    Comment: Performed at [facility], [address] [place], [place] [address]    RADIOLOGY STUDIES/RESULTS: No results found.    LOS: 9 days   Lavada Stank, MD  [facility]    To contact the attending provider between 7A-7P or the covering provider during after hours 7P-7A, please log into the web site [email or web address] and access using universal [place] password for that web site. If you do not have the password, please call the hospital operator.  05/29/2023, 11:13 AM

## 2023-05-29 NOTE — Progress Notes (Signed)
 Hand mits removed at this time

## 2023-05-29 NOTE — Plan of Care (Signed)
  Problem: Education: Goal: Knowledge of General Education information will improve Description: Including pain rating scale, medication(s)/side effects and non-pharmacologic comfort measures Outcome: Not Progressing   Problem: Health Behavior/Discharge Planning: Goal: Ability to manage health-related needs will improve Outcome: Not Progressing   Problem: Clinical Measurements: Goal: Ability to maintain clinical measurements within normal limits will improve Outcome: Not Progressing Goal: Will remain free from infection Outcome: Not Progressing Goal: Diagnostic test results will improve Outcome: Not Progressing Goal: Respiratory complications will improve Outcome: Not Progressing Goal: Cardiovascular complication will be avoided Outcome: Not Progressing   Problem: Activity: Goal: Risk for activity intolerance will decrease Outcome: Not Progressing   Problem: Nutrition: Goal: Adequate nutrition will be maintained Outcome: Not Progressing   Problem: Elimination: Goal: Will not experience complications related to bowel motility Outcome: Not Progressing Goal: Will not experience complications related to urinary retention Outcome: Not Progressing   Problem: Coping: Goal: Level of anxiety will decrease Outcome: Not Progressing   Problem: Pain Managment: Goal: General experience of comfort will improve and/or be controlled Outcome: Not Progressing   Problem: Safety: Goal: Ability to remain free from injury will improve Outcome: Not Progressing   Problem: Skin Integrity: Goal: Risk for impaired skin integrity will decrease Outcome: Not Progressing   Problem: Education: Goal: Knowledge of the prescribed therapeutic regimen will improve Outcome: Not Progressing   Problem: Coping: Goal: Ability to identify and develop effective coping behavior will improve Outcome: Not Progressing   Problem: Clinical Measurements: Goal: Quality of life will improve Outcome: Not  Progressing   Problem: Respiratory: Goal: Verbalizations of increased ease of respirations will increase Outcome: Not Progressing   Problem: Pain Management: Goal: Satisfaction with pain management regimen will improve Outcome: Not Progressing

## 2023-05-30 DIAGNOSIS — E860 Dehydration: Secondary | ICD-10-CM | POA: Diagnosis not present

## 2023-05-30 DIAGNOSIS — Z515 Encounter for palliative care: Secondary | ICD-10-CM | POA: Diagnosis not present

## 2023-05-30 DIAGNOSIS — N179 Acute kidney failure, unspecified: Secondary | ICD-10-CM | POA: Diagnosis not present

## 2023-05-30 DIAGNOSIS — K5289 Other specified noninfective gastroenteritis and colitis: Secondary | ICD-10-CM | POA: Diagnosis not present

## 2023-05-30 NOTE — Progress Notes (Signed)
 PROGRESS NOTE        PATIENT DETAILS Name: Christopher Hobbs Age: 83 y.o. Sex: male Date of Birth: 12-29-1940 Admit Date: 05/19/2023 Admitting Physician Sabas Cradle, MD NUU:[id number], Lesia Raring, MD  Brief Summary: Patient is a 83 y.o.  male with history of dementia, seizures-brought in from SNF for acute metabolic encephalopathy in the setting of AKI/hyponatremia due to failure to thrive syndrome/a UTI.  He has been transition to full comfort care on 05/26/2023, transferred to my care on 05/29/2023.  If survives more than 1 to 2 days transferred to residential hospice or SNF with palliative care/hospice.  Significant events: 1/30>> admit to [facility] 2/06>>transition to comfort care  Significant studies: 1/30>> CT head: No acute intracranial abnormality 1/30>> CT abdomen/pelvis: Large stool in rectosigmoid-stercoral colitis. 1/31>> EEG: No seizures 2/1>> x-ray abdomen: No SBO. 2/2>> CXR: No obvious PNA  Significant microbiology data: 1/30>> COVID/influenza/RSV PCR: Negative 1/30>> blood culture: No growth 1/31>> urine culture: E. coli (pansensitive)  Procedures: None  Consults: None  Subjective: In bed somnolent unable to answer questions but appears to be in no distress now under full comfort care  Objective: Vitals: Blood pressure 122/72, pulse (!) 57, temperature 98.2 F (36.8 C), temperature source Oral, resp. rate 19, height 6' (1.829 m), weight 108.9 kg, SpO2 96%.   Exam: Comfortable-sleeping, only opens eyes to loud commands Not in any distress Easily arouses but then goes back to sleep Generalized weakness-but nonfocal exam.  Pertinent Labs/Radiology:    Latest Ref Rng & Units 05/22/2023    5:26 AM 05/21/2023    5:36 AM 05/20/2023    4:41 AM  CBC  WBC 4.0 - 10.5 K/uL 11.9  12.2  10.5   Hemoglobin 13.0 - 17.0 g/dL 9.6  9.7  9.9   Hematocrit 39.0 - 52.0 % 31.8  33.1  33.9   Platelets 150 - 400 K/uL 174  160  169     Lab Results  Component  Value Date   NA 145 05/26/2023   K 3.9 05/26/2023   CL 120 (H) 05/26/2023   CO2 19 (L) 05/26/2023      Assessment/Plan: AKI Hemodynamically mediated in the setting of poor oral intake/dehydration Resolved with IVF/supportive care-unfortunately continues to have poor oral intake. Off IV fluids since 2/4-renal function stable but sodium slowly trending up.  Suspect that he will redevelop AKI in the near future.  After family meeting with palliative care on 2/6-transitioned to full comfort measures.   Hypernatremia Secondary to poor oral intake-resolved with supportive care including IVF Off all IV fluids since 2/4-oral intake continues to be poor-sodium levels are slowly inching up.  Will likely develop hypernatremia in the near future.   After family meeting with palliative care on 2/6-transitioned to full comfort measures.   Hypokalemia Repleted   Hypomagnesemia Repleted   Hypophosphatemia Repleted   Acute metabolic encephalopathy Due to AKI/hypernatremia CT imaging negative Reviewed prior notes-appears to have very advanced dementia-suspect he will continue to have delirium/encephalopathy when he is hospitalized.   Constipation-fecal impaction-stercoral colitis Ensure bowel regimen Per RN-having BMs-has Flexi-Seal in place. Rocephin /Flagyl  discontinued on 2/4   Complicated UTI Completed Rocephin  x 5 days Unclear why Foley catheter was placed-since he has been transitioned to full comfort measures-plan is to keep Foley catheter in place for comfort.   Normocytic anemia No evidence of blood loss Likely related to chronic  disease/IV fluid dilution/acute illness   Dementia Expect delirium-maintain delirium precautions Per prior notes-depending on nursing staff for ADLs-SNF resident   Seizure disorder CT imaging/EEG stable Continue Trileptal /Keppra  being continued for comfort.   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  Palliative care met with family on 2/6-now full comfort measures-plan is to discharge back to SNF with hospice care.  Unfortunately this can only occur early next week-likely Monday   Class 1 Obesity: Estimated body mass index is 32.55 kg/m as calculated from the following:   Height as of this encounter: 6' (1.829 m).   Weight as of this encounter: 108.9 kg.     Class 1 Obesity: Estimated body mass index is 32.55 kg/m as calculated from the following:   Height as of this encounter: 6' (1.829 m).   Weight as of this encounter: 108.9 kg.   Code status:   Code Status: Do not attempt resuscitation (DNR) - Comfort care   DVT Prophylaxis:    Family Communication: POA-Belinda Gill-[phone]-left another voicemail on 2/3.   Disposition Plan: Status is: Inpatient Remains inpatient appropriate because: Severity of illness   Planned Discharge Destination:Skilled nursing facility   Diet: Diet Order             Diet general           DIET - DYS 1 Room service appropriate? No; Fluid consistency: Thin  Diet effective now                     Antimicrobial agents: Anti-infectives (From admission, onward)    Start     Dose/Rate Route Frequency Ordered Stop   05/22/23 1030  metroNIDAZOLE  (FLAGYL ) IVPB 500 mg  Status:  Discontinued        500 mg 100 mL/hr over 60 Minutes Intravenous Every 12 hours 05/22/23 0938 05/24/23 1116   05/21/23 0800  vancomycin  (VANCOREADY) IVPB 750 mg/150 mL  Status:  Discontinued        750 mg 150 mL/hr over 60 Minutes Intravenous Every 24 hours 05/20/23 0623 05/20/23 0625   05/21/23 0800  vancomycin  (VANCOCIN ) 750 mg in sodium chloride  0.9 % 250 mL IVPB  Status:  Discontinued        750 mg 250 mL/hr over 60  Minutes Intravenous Every 24 hours 05/20/23 0625 05/22/23 0938   05/21/23 0600  cefTRIAXone  (ROCEPHIN ) 1 g in sodium chloride  0.9 % 100 mL IVPB  Status:  Discontinued        1 g 200 mL/hr over 30 Minutes Intravenous Every 24 hours 05/20/23 0603 05/24/23 1116   05/20/23 0615  cefTRIAXone  (ROCEPHIN ) 1 g in sodium chloride  0.9 % 100 mL IVPB        1 g 200 mL/hr over 30 Minutes Intravenous  Once 05/20/23 0607 05/20/23 0903   05/20/23 0615  vancomycin  (VANCOREADY) IVPB 2000 mg/400 mL        2,000 mg 200 mL/hr over 120 Minutes Intravenous  Once 05/20/23 0612 05/20/23 0902        MEDICATIONS: Scheduled Meds:  levETIRAcetam   500 mg Oral BID   mouth rinse  15 mL Mouth Rinse 4 times per day   OXcarbazepine   150 mg Oral QHS   Oxcarbazepine   300 mg Oral Daily   Continuous Infusions:   PRN Meds:.acetaminophen  **OR** acetaminophen , antiseptic  oral rinse, glycopyrrolate  **OR** glycopyrrolate  **OR** glycopyrrolate , haloperidol  **OR** haloperidol  **OR** haloperidol  lactate, LORazepam  **OR** LORazepam  **OR** LORazepam , morphine  injection, morphine  CONCENTRATE, ondansetron  **OR** ondansetron  (ZOFRAN ) IV, mouth rinse, polyvinyl alcohol    I have personally reviewed following labs and imaging studies  LABORATORY DATA: CBC: No results for input(s): "WBC", "NEUTROABS", "HGB", "HCT", "MCV", "PLT" in the last 168 hours.   Basic Metabolic Panel: Recent Labs  Lab 05/24/23 0452 05/25/23 0434 05/26/23 0504  NA 144 144 145  K 3.4* 3.3* 3.9  CL 119* 117* 120*  CO2 18* 19* 19*  GLUCOSE 188* 154* 189*  BUN 17 12 13   CREATININE 1.17 1.06 1.03  CALCIUM 9.0 8.5* 8.7*  MG 1.9 1.7 2.0  PHOS 2.5 2.8  --     GFR: Estimated Creatinine Clearance: 71.7 mL/min (by C-G formula based on SCr of 1.03 mg/dL).  Liver Function Tests: No results for input(s): "AST", "ALT", "ALKPHOS", "BILITOT", "PROT", "ALBUMIN" in the last 168 hours.  No results for input(s): "LIPASE", "AMYLASE" in the last 168 hours. No  results for input(s): "AMMONIA" in the last 168 hours.  Coagulation Profile: No results for input(s): "INR", "PROTIME" in the last 168 hours.   Cardiac Enzymes: No results for input(s): "CKTOTAL", "CKMB", "CKMBINDEX", "TROPONINI" in the last 168 hours.   BNP (last 3 results) No results for input(s): "PROBNP" in the last 8760 hours.  Lipid Profile: No results for input(s): "CHOL", "HDL", "LDLCALC", "TRIG", "CHOLHDL", "LDLDIRECT" in the last 72 hours.  Thyroid Function Tests: No results for input(s): "TSH", "T4TOTAL", "FREET4", "T3FREE", "THYROIDAB" in the last 72 hours.   Anemia Panel: No results for input(s): "VITAMINB12", "FOLATE", "FERRITIN", "TIBC", "IRON", "RETICCTPCT" in the last 72 hours.  Urine analysis:    Component Value Date/Time   COLORURINE AMBER (A) 05/20/2023 0400   APPEARANCEUR HAZY (A) 05/20/2023 0400   LABSPEC 1.017 05/20/2023 0400   PHURINE 5.0 05/20/2023 0400   GLUCOSEU NEGATIVE 05/20/2023 0400   HGBUR LARGE (A) 05/20/2023 0400   BILIRUBINUR NEGATIVE 05/20/2023 0400   KETONESUR NEGATIVE 05/20/2023 0400   PROTEINUR NEGATIVE 05/20/2023 0400   NITRITE NEGATIVE 05/20/2023 0400   LEUKOCYTESUR NEGATIVE 05/20/2023 0400    Sepsis Labs: Lactic Acid, Venous    Component Value Date/Time   LATICACIDVEN 1.5 05/21/2023 0223    MICROBIOLOGY: Recent Results (from the past 240 hours)  Gastrointestinal Panel by PCR , Stool     Status: None   Collection Time: 05/21/23  6:23 PM   Specimen: STOOL  Result Value Ref Range Status   Campylobacter species NOT DETECTED NOT DETECTED Final   Plesimonas shigelloides NOT DETECTED NOT DETECTED Final   Salmonella species NOT DETECTED NOT DETECTED Final   Yersinia enterocolitica NOT DETECTED NOT DETECTED Final   Vibrio species NOT DETECTED NOT DETECTED Final   Vibrio cholerae NOT DETECTED NOT DETECTED Final   Enteroaggregative E coli (EAEC) NOT DETECTED NOT DETECTED Final   Enteropathogenic E coli (EPEC) NOT DETECTED NOT  DETECTED Final   Enterotoxigenic E coli (ETEC) NOT DETECTED NOT DETECTED Final   Shiga like toxin producing E coli (STEC) NOT DETECTED NOT DETECTED Final   Shigella/Enteroinvasive E coli (EIEC) NOT DETECTED NOT DETECTED Final   Cryptosporidium NOT DETECTED NOT DETECTED Final   Cyclospora cayetanensis NOT DETECTED NOT DETECTED Final   Entamoeba histolytica NOT DETECTED NOT DETECTED Final   Giardia lamblia NOT DETECTED NOT DETECTED Final   Adenovirus F40/41 NOT DETECTED NOT DETECTED Final   Astrovirus NOT DETECTED NOT DETECTED Final   Norovirus GI/GII NOT  DETECTED NOT DETECTED Final   Rotavirus A NOT DETECTED NOT DETECTED Final   Sapovirus (I, II, IV, and V) NOT DETECTED NOT DETECTED Final    Comment: Performed at [facility], [address] [place], [place] [address]  C Difficile Quick Screen w PCR reflex     Status: None   Collection Time: 05/21/23  6:23 PM   Specimen: STOOL  Result Value Ref Range Status   C Diff antigen NEGATIVE NEGATIVE Final   C Diff toxin NEGATIVE NEGATIVE Final   C Diff interpretation No C. difficile detected.  Final    Comment: Performed at [facility], [address] [place], [place] [address]    RADIOLOGY STUDIES/RESULTS: No results found.    LOS: 10 days   Lynnwood Sauer, MD  [facility]    To contact the attending provider between 7A-7P or the covering provider during after hours 7P-7A, please log into the web site [email or web address] and access using universal [place] password for that web site. If you do not have the password, please call the hospital operator.  05/30/2023, 10:48 AM

## 2023-05-30 NOTE — Progress Notes (Signed)
 Daily Progress Note   Patient Name: Christopher Hobbs       Date: 05/30/2023 DOB: 08-05-1940  Age: 83 y.o. MRN#: [id number] Attending Physician: Cala Castleman, MD Primary Care Physician: Jearldine Mina, MD Admit Date: 05/19/2023  Reason for Consultation/Follow-up: Non pain symptom management, Pain control, Psychosocial/spiritual support, and Terminal Care  Subjective: I have reviewed medical records including EPIC notes, MAR, any available advanced directives as necessary, and labs. Received report from primary RN - no acute concerns.   Went to visit patient at bedside - no family/visitors present. Patient was lying in bed asleep - he does briefly open his eyes when I repositioned his upper body for comfort. No signs or non-verbal gestures of pain or discomfort noted. No respiratory distress, increased work of breathing, or secretions noted. Patient had self removed [place], which was at 1L - removed oxygen.   Plan remains for patient's discharge back to [facility] after facility MD placed orders for return - hopefully today.   Patient appears stable for transfer today.   Length of Stay: 10  Current Medications: Scheduled Meds:   levETIRAcetam   500 mg Oral BID   mouth rinse  15 mL Mouth Rinse 4 times per day   OXcarbazepine   150 mg Oral QHS   Oxcarbazepine   300 mg Oral Daily    Continuous Infusions:   PRN Meds: acetaminophen  **OR** acetaminophen , antiseptic oral rinse, glycopyrrolate  **OR** glycopyrrolate  **OR** glycopyrrolate , haloperidol  **OR** haloperidol  **OR** haloperidol  lactate, LORazepam  **OR** LORazepam  **OR** LORazepam , morphine  injection, morphine  CONCENTRATE, ondansetron  **OR** ondansetron  (ZOFRAN ) IV, mouth rinse, polyvinyl alcohol   Physical Exam Vitals and nursing note  reviewed.  Constitutional:      General: He is not in acute distress.    Appearance: He is ill-appearing.  Pulmonary:     Effort: No respiratory distress.  Skin:    General: Skin is warm and dry.  Neurological:     Motor: Weakness present.             Vital Signs: BP 122/72   Pulse (!) 57   Temp 98.2 F (36.8 C) (Oral)   Resp 19   Ht 6' (1.829 m)   Wt 108.9 kg   SpO2 96%   BMI 32.55 kg/m  SpO2: SpO2: 96 % O2 Device: O2 Device: Nasal Cannula O2 Flow Rate:    Intake/output  summary:  Intake/Output Summary (Last 24 hours) at 05/30/2023 0958 Last data filed at 05/30/2023 1610 Gross per 24 hour  Intake 0 ml  Output 500 ml  Net -500 ml   LBM: Last BM Date : 05/29/23 Baseline Weight: Weight: 108.9 kg Most recent weight: Weight: 108.9 kg       Palliative Assessment/Data: PPS 10-20%      Patient Active Problem List   Diagnosis Date Noted   AKI (acute kidney injury) (HCC) 05/20/2023    Palliative Care Assessment & Plan   Patient Profile: 83 y.o. male with past medical history of dementia and seizure presented to ED from facility with AMS and hypotension. Patient was admitted on 05/19/2023 with severe dehydration leading to AKI, sepsis, hypernatremia/hypokalemia, and acute metabolic encephalopathy.   Assessment: Principal Problem:   AKI (acute kidney injury) (HCC)   Terminal care  Recommendations/Plan: Continue full comfort measures Continue DNR/DNI as previously documented Plan for discharge back to [facility] with hospice  Continue current comfort focused medication regimen - no changes Added orders for EOL symptom management and to reflect full comfort measures, as well as discontinued orders that were not focused on comfort  PMT will continue to follow and support holistically   Symptom Management Morphine  PO/IV PRN  pain/dyspnea/increased work of breathing/RR>25 Tylenol  PRN pain/fever Biotin PRN dry mouth Robinul  PRN secretions Haldol  PRN  agitation/delirium Ativan  PRN anxiety/seizure/sleep/distress Zofran  PRN nausea/vomiting Liquifilm Tears PRN dry eye Continue Keppra  and trileptal  while tolerating POs  Goals of Care and Additional Recommendations: Limitations on Scope of Treatment: Full Comfort Care  Code Status:    Code Status Orders  (From admission, onward)           Start     Ordered   05/26/23 1032  Do not attempt resuscitation (DNR) - Comfort care  Continuous       Question Answer Comment  If patient has no pulse and is not breathing Do Not Attempt Resuscitation   In Pre-Arrest Conditions (Patient Is Breathing and Has a Pulse) Provide comfort measures. Relieve any mechanical airway obstruction. Avoid transfer unless required for comfort.   Consent: Discussion documented in EHR or advanced directives reviewed      05/26/23 1031           Code Status History     Date Active Date Inactive Code Status Order ID Comments User Context   05/20/2023 0604 05/26/2023 1031 Limited: Do not attempt resuscitation (DNR) -DNR-LIMITED -Do Not Intubate/DNI  [id number]  Sabas Cradle, MD ED   05/20/2023 0332 05/20/2023 0604 Full Code [id number]  Sabas Cradle, MD ED      Advance Directive Documentation    Flowsheet Row Most Recent Value  Type of Advance Directive Healthcare Power of Attorney, Living will  Pre-existing out of facility DNR order (yellow form or pink MOST form) --  "MOST" Form in Place? --       Prognosis:  < 2 weeks  Discharge Planning: Skilled Nursing Facility with Hospice  Care plan was discussed with primary RN, TOC, hospice liaison  Thank you for allowing the Palliative Medicine Team to assist in the care of this patient.     Annette Killings, NP  Please contact Palliative Medicine Team phone at [phone] for questions and concerns.   *Portions of this note are a verbal dictation therefore any spelling and/or grammatical errors are due to the "[employer] One" system  interpretation.

## 2023-05-30 NOTE — Plan of Care (Signed)
  Problem: Clinical Measurements: Goal: Will remain free from infection Outcome: Progressing Goal: Respiratory complications will improve Outcome: Progressing Goal: Cardiovascular complication will be avoided Outcome: Progressing   Problem: Activity: Goal: Risk for activity intolerance will decrease Outcome: Progressing   Problem: Pain Managment: Goal: General experience of comfort will improve and/or be controlled Outcome: Progressing

## 2023-05-30 NOTE — Progress Notes (Signed)
   This pt has been approved for hospice services by our MD upon d/c to [facility]. We continue to work with the facility to get contract in place to serve the pt in this SNF. The administrative dept. At SNF is in agreement with us  serving the pt there if we agree to the changes they have recommended in the contract. Our office has sent contract back to them for signature.   Lyla Samuels RN [phone]

## 2023-05-30 NOTE — TOC Progression Note (Addendum)
 Transition of Care [facility]) - Progression Note    Patient Details  Name: Christopher Hobbs MRN: [id number] Date of Birth: 05/24/1940  [facility] CM/SW Contact  Jannice Mends, LCSW Phone Number: 05/30/2023, 8:35 AM  Clinical Narrative:    8:35am-CSW awaiting confirmation from [facility] that contract and orders are in place from their MD to arrange Hospice.   4:16 PM-Per [facility], they have submitted the contract but cannot provide orders until the patient is admitted. CSW updated Hospice and will plan on sending patient back to [facility] tomorrow and Hospice will follow up. CSW left voicemail for patient's niece, Jorge Newcomer.   Expected Discharge Plan: Skilled Nursing Facility Barriers to Discharge: Continued Medical Work up  Expected Discharge Plan and Services In-house Referral: Clinical Social Work, Hospice / Palliative Care   Post Acute Care Choice: Hospice, Skilled Nursing Facility Living arrangements for the past 2 months: Skilled Nursing Facility                                       Social Determinants of Health (SDOH) Interventions SDOH Screenings   Food Insecurity: Patient Unable To Answer (05/20/2023)  Housing: Patient Unable To Answer (05/20/2023)  Transportation Needs: Patient Unable To Answer (05/20/2023)  Utilities: Patient Unable To Answer (05/20/2023)  Social Connections: Unknown (05/20/2023)  Tobacco Use: Low Risk  (05/20/2023)    Readmission Risk Interventions     No data to display

## 2023-05-31 DIAGNOSIS — Z7189 Other specified counseling: Secondary | ICD-10-CM | POA: Diagnosis not present

## 2023-05-31 DIAGNOSIS — N179 Acute kidney failure, unspecified: Secondary | ICD-10-CM | POA: Diagnosis not present

## 2023-05-31 DIAGNOSIS — Z66 Do not resuscitate: Secondary | ICD-10-CM | POA: Diagnosis not present

## 2023-05-31 DIAGNOSIS — Z515 Encounter for palliative care: Secondary | ICD-10-CM | POA: Diagnosis not present

## 2023-05-31 MED ORDER — MORPHINE SULFATE (CONCENTRATE) 10 MG /0.5 ML PO SOLN
5.0000 mg | ORAL | 0 refills | Status: AC | PRN
Start: 2023-05-31 — End: ?

## 2023-05-31 NOTE — Discharge Instructions (Signed)
Disposition.  Nursing home with hospice Condition.  Guarded CODE STATUS.  DNR Activity.  With assistance as tolerated, full fall precautions. Diet.  Soft with feeding assistance and aspiration precautions. Goal of care.  Comfort.

## 2023-05-31 NOTE — Progress Notes (Signed)
 PROGRESS NOTE        PATIENT DETAILS Name: Tyreek Clabo Age: 83 y.o. Sex: male Date of Birth: Mar 08, 1941 Admit Date: 05/19/2023 Admitting Physician Lurline Del, MD ZOX:[id number], Jerelyn Scott, MD  Brief Summary: Patient is a 83 y.o.  male with history of dementia, seizures-brought in from SNF for acute metabolic encephalopathy in the setting of AKI/hyponatremia due to failure to thrive syndrome/a UTI.  He has been transition to full comfort care on 05/26/2023, transferred to my care on 05/29/2023.  If survives more than 1 to 2 days transferred to residential hospice or SNF with palliative care/hospice.  Significant events: 1/30>> admit to [facility] 2/06>>transition to comfort care  Significant studies: 1/30>> CT head: No acute intracranial abnormality 1/30>> CT abdomen/pelvis: Large stool in rectosigmoid-stercoral colitis. 1/31>> EEG: No seizures 2/1>> x-ray abdomen: No SBO. 2/2>> CXR: No obvious PNA  Significant microbiology data: 1/30>> COVID/influenza/RSV PCR: Negative 1/30>> blood culture: No growth 1/31>> urine culture: E. coli (pansensitive)  Procedures: None  Consults: None  Subjective: In bed somnolent unable to answer questions but appears to be in no distress now under full comfort care  Objective: Vitals: Blood pressure 134/77, pulse 70, temperature 98.3 F (36.8 C), temperature source Oral, resp. rate 20, height 6' (1.829 m), weight 108.9 kg, SpO2 94%.   Exam: Comfortable-sleeping, only opens eyes to loud commands Not in any distress Easily arouses but then goes back to sleep Generalized weakness-but nonfocal exam.  Pertinent Labs/Radiology:    Latest Ref Rng & Units 05/22/2023    5:26 AM 05/21/2023    5:36 AM 05/20/2023    4:41 AM  CBC  WBC 4.0 - 10.5 K/uL 11.9  12.2  10.5   Hemoglobin 13.0 - 17.0 g/dL 9.6  9.7  9.9   Hematocrit 39.0 - 52.0 % 31.8  33.1  33.9   Platelets 150 - 400 K/uL 174  160  169     Lab Results  Component Value  Date   NA 145 05/26/2023   K 3.9 05/26/2023   CL 120 (H) 05/26/2023   CO2 19 (L) 05/26/2023      Assessment/Plan: AKI Hemodynamically mediated in the setting of poor oral intake/dehydration Resolved with IVF/supportive care-unfortunately continues to have poor oral intake. Off IV fluids since 2/4-renal function stable but sodium slowly trending up.  Suspect that he will redevelop AKI in the near future.  After family meeting with palliative care on 2/6-transitioned to full comfort measures.   Hypernatremia Secondary to poor oral intake-resolved with supportive care including IVF Off all IV fluids since 2/4-oral intake continues to be poor-sodium levels are slowly inching up.  Will likely develop hypernatremia in the near future.   After family meeting with palliative care on 2/6-transitioned to full comfort measures.   Hypokalemia Repleted   Hypomagnesemia Repleted   Hypophosphatemia Repleted   Acute metabolic encephalopathy Due to AKI/hypernatremia CT imaging negative Reviewed prior notes-appears to have very advanced dementia-suspect he will continue to have delirium/encephalopathy when he is hospitalized.   Constipation-fecal impaction-stercoral colitis Ensure bowel regimen Per RN-having BMs-has Flexi-Seal in place. Rocephin/Flagyl discontinued on 2/4   Complicated UTI Completed Rocephin x 5 days Unclear why Foley catheter was placed-since he has been transitioned to full comfort measures-plan is to keep Foley catheter in place for comfort.   Normocytic anemia No evidence of blood loss Likely related to chronic disease/IV  fluid dilution/acute illness   Dementia Expect delirium-maintain delirium precautions Per prior notes-depending on nursing staff for ADLs-SNF resident   Seizure disorder CT imaging/EEG stable Continue Trileptal/Keppra being continued for comfort.   Severe failure to thrive syndrome Palliative care DNR in place Advanced dementia-very poor  oral intake-severe failure to thrive syndrome-presented with hypernatremia and AKI-continues to have very poor oral intake during this hospitalization-at risk for recurrent electrolyte derangement/dehydration-repeat hospitalization.  Palliative care met with family on 2/6-now full comfort measures-plan is to discharge back to SNF with hospice care.  Unfortunately this can only occur early next week-likely Monday   Class 1 Obesity: Estimated body mass index is 32.55 kg/m as calculated from the following:   Height as of this encounter: 6' (1.829 m).   Weight as of this encounter: 108.9 kg.     Class 1 Obesity: Estimated body mass index is 32.55 kg/m as calculated from the following:   Height as of this encounter: 6' (1.829 m).   Weight as of this encounter: 108.9 kg.   Code status:   Code Status: Do not attempt resuscitation (DNR) - Comfort care   DVT Prophylaxis:    Family Communication: POA-Belinda Gill-[phone]-left another voicemail on 2/3.   Disposition Plan: Status is: Inpatient Remains inpatient appropriate because: Severity of illness   Planned Discharge Destination:Skilled nursing facility   Diet: Diet Order             Diet general           DIET - DYS 1 Room service appropriate? No; Fluid consistency: Thin  Diet effective now                     Antimicrobial agents: Anti-infectives (From admission, onward)    Start     Dose/Rate Route Frequency Ordered Stop   05/22/23 1030  metroNIDAZOLE (FLAGYL) IVPB 500 mg  Status:  Discontinued        500 mg 100 mL/hr over 60 Minutes Intravenous Every 12 hours 05/22/23 0938 05/24/23 1116   05/21/23 0800  vancomycin (VANCOREADY) IVPB 750 mg/150 mL  Status:  Discontinued        750 mg 150 mL/hr over 60 Minutes Intravenous Every 24 hours 05/20/23 0623 05/20/23 0625   05/21/23 0800  vancomycin (VANCOCIN) 750 mg in sodium chloride 0.9 % 250 mL IVPB  Status:  Discontinued        750 mg 250 mL/hr over 60 Minutes  Intravenous Every 24 hours 05/20/23 0625 05/22/23 0938   05/21/23 0600  cefTRIAXone (ROCEPHIN) 1 g in sodium chloride 0.9 % 100 mL IVPB  Status:  Discontinued        1 g 200 mL/hr over 30 Minutes Intravenous Every 24 hours 05/20/23 0603 05/24/23 1116   05/20/23 0615  cefTRIAXone (ROCEPHIN) 1 g in sodium chloride 0.9 % 100 mL IVPB        1 g 200 mL/hr over 30 Minutes Intravenous  Once 05/20/23 0607 05/20/23 0903   05/20/23 0615  vancomycin (VANCOREADY) IVPB 2000 mg/400 mL        2,000 mg 200 mL/hr over 120 Minutes Intravenous  Once 05/20/23 0612 05/20/23 0902        MEDICATIONS: Scheduled Meds:  levETIRAcetam  500 mg Oral BID   mouth rinse  15 mL Mouth Rinse 4 times per day   OXcarbazepine  150 mg Oral QHS   Oxcarbazepine  300 mg Oral Daily   Continuous Infusions:   PRN Meds:.acetaminophen **OR** acetaminophen, antiseptic oral  rinse, glycopyrrolate **OR** glycopyrrolate **OR** glycopyrrolate, haloperidol **OR** haloperidol **OR** haloperidol lactate, LORazepam **OR** LORazepam **OR** LORazepam, morphine injection, morphine CONCENTRATE, ondansetron **OR** ondansetron (ZOFRAN) IV, mouth rinse, polyvinyl alcohol   I have personally reviewed following labs and imaging studies  LABORATORY DATA: CBC: No results for input(s): "WBC", "NEUTROABS", "HGB", "HCT", "MCV", "PLT" in the last 168 hours.   Basic Metabolic Panel: Recent Labs  Lab 05/25/23 0434 05/26/23 0504  NA 144 145  K 3.3* 3.9  CL 117* 120*  CO2 19* 19*  GLUCOSE 154* 189*  BUN 12 13  CREATININE 1.06 1.03  CALCIUM 8.5* 8.7*  MG 1.7 2.0  PHOS 2.8  --     GFR: Estimated Creatinine Clearance: 71.7 mL/min (by C-G formula based on SCr of 1.03 mg/dL).  Liver Function Tests: No results for input(s): "AST", "ALT", "ALKPHOS", "BILITOT", "PROT", "ALBUMIN" in the last 168 hours.  No results for input(s): "LIPASE", "AMYLASE" in the last 168 hours. No results for input(s): "AMMONIA" in the last 168 hours.  Coagulation  Profile: No results for input(s): "INR", "PROTIME" in the last 168 hours.   Cardiac Enzymes: No results for input(s): "CKTOTAL", "CKMB", "CKMBINDEX", "TROPONINI" in the last 168 hours.   BNP (last 3 results) No results for input(s): "PROBNP" in the last 8760 hours.  Lipid Profile: No results for input(s): "CHOL", "HDL", "LDLCALC", "TRIG", "CHOLHDL", "LDLDIRECT" in the last 72 hours.  Thyroid Function Tests: No results for input(s): "TSH", "T4TOTAL", "FREET4", "T3FREE", "THYROIDAB" in the last 72 hours.   Anemia Panel: No results for input(s): "VITAMINB12", "FOLATE", "FERRITIN", "TIBC", "IRON", "RETICCTPCT" in the last 72 hours.  Urine analysis:    Component Value Date/Time   COLORURINE AMBER (A) 05/20/2023 0400   APPEARANCEUR HAZY (A) 05/20/2023 0400   LABSPEC 1.017 05/20/2023 0400   PHURINE 5.0 05/20/2023 0400   GLUCOSEU NEGATIVE 05/20/2023 0400   HGBUR LARGE (A) 05/20/2023 0400   BILIRUBINUR NEGATIVE 05/20/2023 0400   KETONESUR NEGATIVE 05/20/2023 0400   PROTEINUR NEGATIVE 05/20/2023 0400   NITRITE NEGATIVE 05/20/2023 0400   LEUKOCYTESUR NEGATIVE 05/20/2023 0400    Sepsis Labs: Lactic Acid, Venous    Component Value Date/Time   LATICACIDVEN 1.5 05/21/2023 0223    MICROBIOLOGY: Recent Results (from the past 240 hours)  Gastrointestinal Panel by PCR , Stool     Status: None   Collection Time: 05/21/23  6:23 PM   Specimen: STOOL  Result Value Ref Range Status   Campylobacter species NOT DETECTED NOT DETECTED Final   Plesimonas shigelloides NOT DETECTED NOT DETECTED Final   Salmonella species NOT DETECTED NOT DETECTED Final   Yersinia enterocolitica NOT DETECTED NOT DETECTED Final   Vibrio species NOT DETECTED NOT DETECTED Final   Vibrio cholerae NOT DETECTED NOT DETECTED Final   Enteroaggregative E coli (EAEC) NOT DETECTED NOT DETECTED Final   Enteropathogenic E coli (EPEC) NOT DETECTED NOT DETECTED Final   Enterotoxigenic E coli (ETEC) NOT DETECTED NOT  DETECTED Final   Shiga like toxin producing E coli (STEC) NOT DETECTED NOT DETECTED Final   Shigella/Enteroinvasive E coli (EIEC) NOT DETECTED NOT DETECTED Final   Cryptosporidium NOT DETECTED NOT DETECTED Final   Cyclospora cayetanensis NOT DETECTED NOT DETECTED Final   Entamoeba histolytica NOT DETECTED NOT DETECTED Final   Giardia lamblia NOT DETECTED NOT DETECTED Final   Adenovirus F40/41 NOT DETECTED NOT DETECTED Final   Astrovirus NOT DETECTED NOT DETECTED Final   Norovirus GI/GII NOT DETECTED NOT DETECTED Final   Rotavirus A NOT DETECTED NOT DETECTED Final  Sapovirus (I, II, IV, and V) NOT DETECTED NOT DETECTED Final    Comment: Performed at [facility], [address] [place], [place] [address]  C Difficile Quick Screen w PCR reflex     Status: None   Collection Time: 05/21/23  6:23 PM   Specimen: STOOL  Result Value Ref Range Status   C Diff antigen NEGATIVE NEGATIVE Final   C Diff toxin NEGATIVE NEGATIVE Final   C Diff interpretation No C. difficile detected.  Final    Comment: Performed at [facility], [address], [place], [place] [address]    RADIOLOGY STUDIES/RESULTS: No results found.    LOS: 11 days   Susa Raring, MD  [facility]    To contact the attending provider between 7A-7P or the covering provider during after hours 7P-7A, please log into the web site [email or web address] and access using universal [place] password for that web site. If you do not have the password, please call the hospital operator.  05/31/2023, 8:15 AM

## 2023-05-31 NOTE — TOC Transition Note (Signed)
Transition of Care [facility]) - Discharge Note   Patient Details  Name: Christopher Hobbs MRN: [id number] Date of Birth: 13-Sep-1940  Transition of [facility]) CM/SW Contact:  Mearl Latin, LCSW Phone Number: 05/31/2023, 11:52 AM   Clinical Narrative:    Patient will DC to: [facility] Anticipated DC date: 05/31/23 Family notified: Edson Snowball and Massie Bougie Transport by: Sharin Mons   Per MD patient ready for DC to [facility] RN to call report prior to discharge [phone] room 114B). RN, patient, patient's family, and facility notified of DC. Discharge Summary and FL2 sent to facility. DC packet on chart including signed scripts and DNR. Ambulance transport requested for patient.   CSW will sign off for now as social work intervention is no longer needed. Please consult [place] again if new needs arise.     Final next level of care: Skilled Nursing Facility Barriers to Discharge: Barriers Resolved   Patient Goals and CMS Choice Patient states their goals for this hospitalization and ongoing recovery are:: Comfort CMS Medicare.gov Compare Post Acute Care list provided to:: Patient Represenative (must comment) Choice offered to / list presented to : [facility] POA / Guardian      Discharge Placement   Existing PASRR number confirmed : 05/31/23          Patient chooses bed at: [facility] Patient to be transferred to facility by: [facility] Name of family member notified: [place] Patient and family notified of of transfer: 05/31/23  Discharge Plan and Services Additional resources added to the After Visit Summary for   In-house Referral: Clinical Social Work, Hospice / Palliative Care   Post Acute Care Choice: Hospice, Skilled Nursing Facility                               Social Drivers of Health (SDOH) Interventions SDOH Screenings   Food Insecurity: Patient Unable To Answer (05/20/2023)  Housing: Patient Unable To Answer (05/20/2023)  Transportation Needs: Patient Unable To Answer (05/20/2023)   Utilities: Patient Unable To Answer (05/20/2023)  Social Connections: Unknown (05/20/2023)  Tobacco Use: Low Risk  (05/20/2023)     Readmission Risk Interventions     No data to display

## 2023-05-31 NOTE — Progress Notes (Signed)
Daily Progress Note   Patient Name: Christopher Hobbs       Date: 05/31/2023 DOB: Feb 05, 1941  Age: 83 y.o. MRN#: [id number] Attending Physician: Leroy Sea, MD Primary Care Physician: Georgann Housekeeper, MD Admit Date: 05/19/2023 Length of Stay: 11 days  Reason for Consultation/Follow-up: Establishing goals of care  HPI/Patient Profile:  83 y.o. male with past medical history of dementia and seizure presented to ED from facility with AMS and hypotension. Patient was admitted on 05/19/2023 with severe dehydration leading to AKI, sepsis, hypernatremia/hypokalemia, and acute metabolic encephalopathy.   Subjective:   Subjective: Chart Reviewed. Updates received. Patient Assessed. Created space and opportunity for patient  and family to explore thoughts and feelings regarding current medical situation.  Today's Discussion: Today saw the patient bedside, no family was present.  The patient appeared comfortable, was sleeping peacefully and I elected not to wake him.  His respirations were regular and unlabored, though shallow. His meal tray was at the bedside table, untouched.  After saw the patient I called and spoke with his niece [place].  I updated Chane on the status of the patient, informed her that he was sleeping and looked peaceful.  I also updated her on the current progress of getting back to [facility] with [facility] services in place.  I shared that as of note yesterday from [facility] the facility has agreed to the contract and will place orders for hospice services when she arrives.  She expressed her concern about there being a delay and getting the order placed to allow hospice to see him in place.  I shared that she should advocate strongly for him when he gets back to the facility and [facility] [place] will do the same.  I shared that I would update their liaison on the status as well.  We spent some time talking about the patient and sharing stories and expressed gratitude for  time with him.  I provided emotional and general support to the patient through reassurance and therapeutic touch and to family through therapeutic listening, empathy, and other techniques. I answered all questions and addressed all concerns to the best of my ability.  Review of Systems  Unable to perform ROS   Objective:   Vital Signs:  BP (!) 168/119   Pulse 70   Temp 98.3 F (36.8 C) (Oral)   Resp 20   Ht 6' (1.829 m)   Wt 108.9 kg   SpO2 94%   BMI 32.55 kg/m   Physical Exam Vitals and nursing note reviewed.  Constitutional:      General: He is sleeping. He is not in acute distress.    Appearance: He is ill-appearing.  HENT:     Head: Normocephalic and atraumatic.  Cardiovascular:     Rate and Rhythm: Normal rate.  Pulmonary:     Effort: Pulmonary effort is normal. No respiratory distress.  Abdominal:     General: Abdomen is flat. There is no distension.  Skin:    General: Skin is warm and dry.    Palliative Assessment/Data: 10-20%    Existing Vynca/ACP Documentation: None  Assessment & Plan:   Impression: Present on Admission:  AKI (acute kidney injury) (HCC)  83 year old male with acute presentation chronic comorbidities as described above.  The patient is approaching end-of-life, has had a more rapid acceleration of his dementia.  He is not eating and drinking enough to sustain himself, although he does occasionally eat if he is hand fed.  Family  understands the situation and they agree that he is approaching end-of-life.  We have elected to transition to comfort care and engaged with hospice services at his long-term care facility [facility].  Appears that [facility] has accepted patient back with hospice in place, pending final details.  Unfortunately they are unable to put any order for hospice services until the patient is back at the facility, anticipated to be today. We will continue to help coordinate this and work with them to obtain the best care  possible. Overall prognosis poor.  SUMMARY OF RECOMMENDATIONS   DNR-comfort Continue comfort care See symptom management orders below Continued emotional support of patient and family Anticipate d/c to [facility] with [facility] [place] in place today Palliative medicine will continue support for comfort care  Symptom Management:  Tylenol 650 mg PR every 6 hours as needed mild pain or fever Robinul 0.2 mg IV every 4 hours as needed excessive secretions Haldol 0.5 mg IV every 4 hours as needed agitation or delirium Ativan 1 mg IV every 4 hours as needed anxiety CONTINUE Keppra 500 mg oral twice daily for seizure prophylaxis Morphine 1 to 4 mg IV every 15 minutes as needed severe pain or signs of pain/distress Zofran 4 mg IV every 6 hours as needed nausea Polyvinyl alcohol 1.4% ophthalmic 1 drop OU 4 times daily as needed dry eyes  Code Status: DNR-comfort  Prognosis: < 6 months  Discharge Planning: Skilled Nursing Facility with Hospice  Discussed with: Patient's family, medical team, nursing team, hospice liaison  Thank you for allowing [place] to participate in the care of Adi Doro PMT will continue to support holistically.  Time Total: 42 min  Detailed review of medical records (labs, imaging, vital signs), medically appropriate exam, discussed with treatment team, counseling and education to patient, family, & staff, documenting clinical information, medication management, coordination of care  Wynne Dust, NP Palliative Medicine Team  Team Phone # [phone] (Nights/Weekends)  12/16/2020, 8:17 AM

## 2023-07-19 DEATH — deceased

## 2024-03-24 IMAGING — DX DG CHEST 1V PORT
1 series · 1 of 1 positions shown · non-contrast
Comparison: Chest x-ray 08/16/2017.

CLINICAL DATA: Abdominal pain with vomiting.

EXAM:
PORTABLE CHEST 1 VIEW

[chest ap]
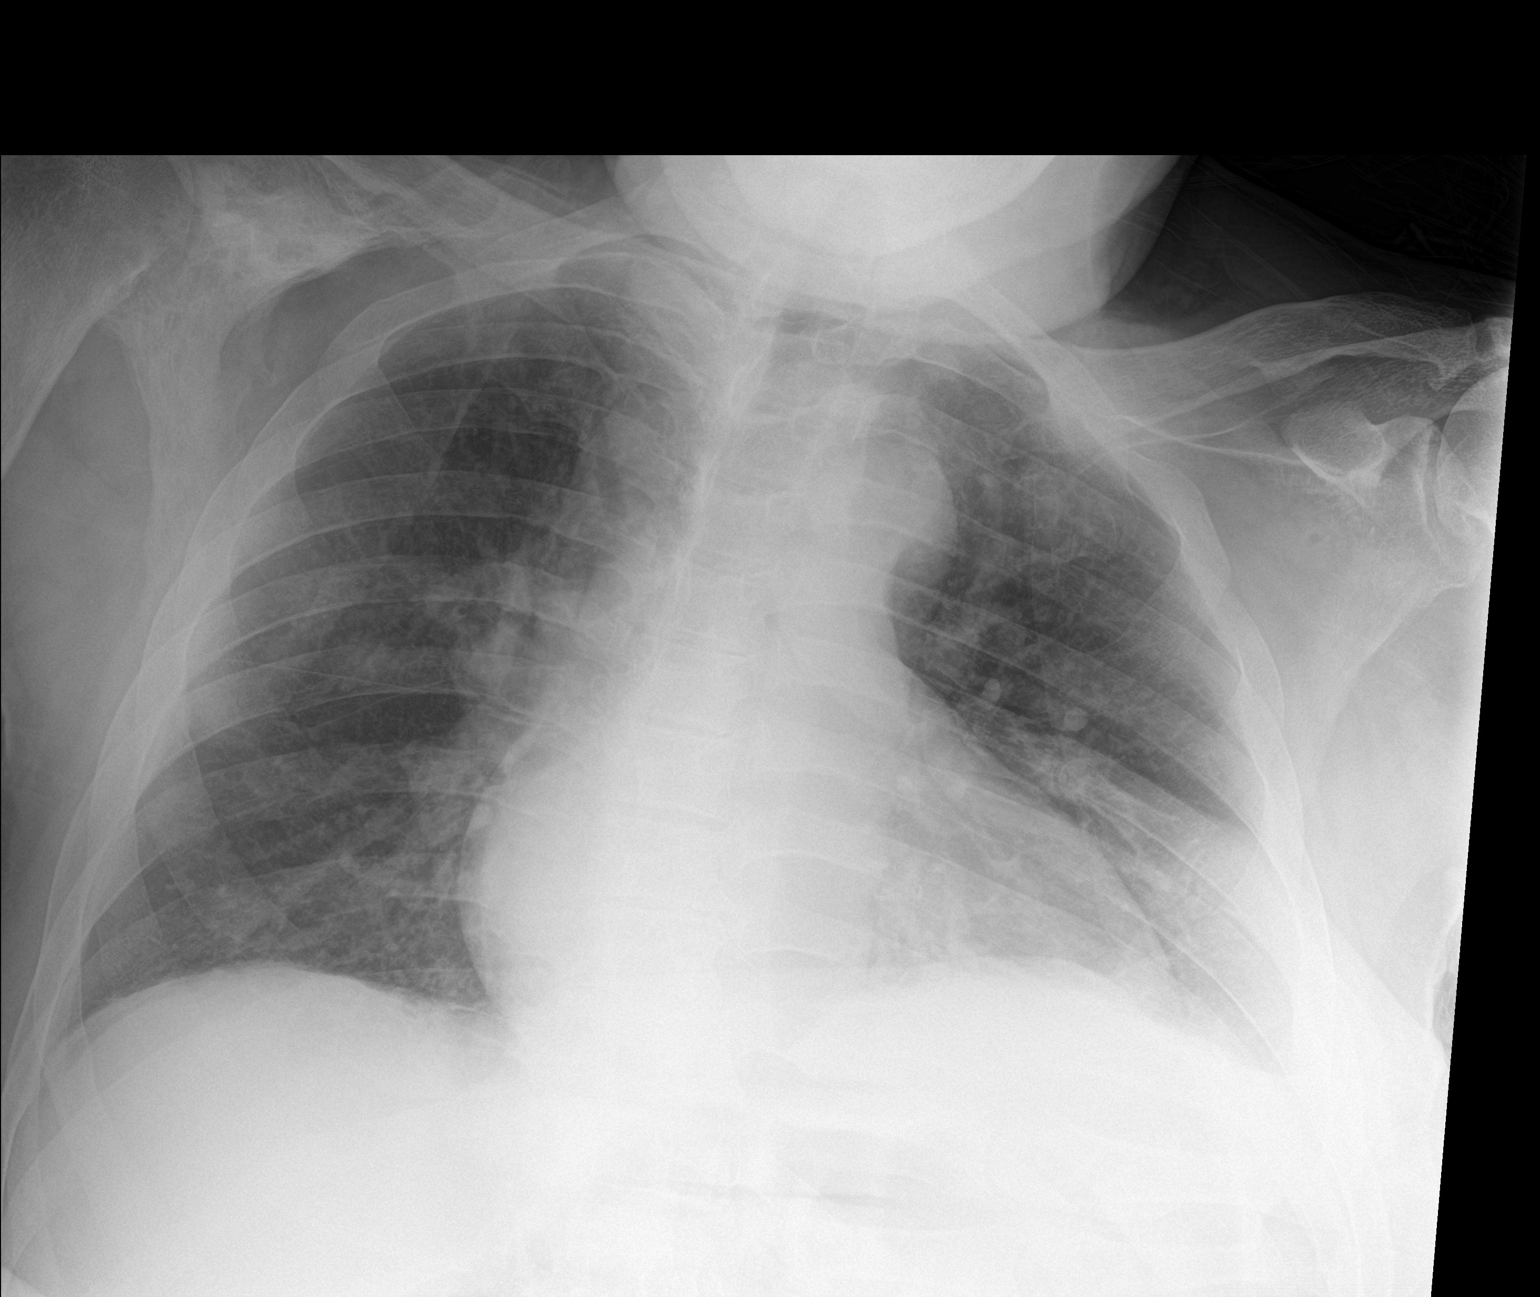

[1 of 1 positions shown; findings below may reference images not displayed]

FINDINGS: Patient's hand is overlying the left lower chest. The heart size and
mediastinal contours are within normal limits. Both lungs are clear.
The visualized skeletal structures are unremarkable.
IMPRESSION: No active disease.
